# Patient Record
Sex: Male | Born: 1980 | Race: Black or African American | Hispanic: No | Marital: Single | Smoking: Former smoker
Health system: Southern US, Community
[De-identification: ages and names within clinical notes are randomized; demographics above are authoritative.]

## PROBLEM LIST (undated history)

## (undated) DIAGNOSIS — M1990 Unspecified osteoarthritis, unspecified site: Secondary | ICD-10-CM

## (undated) DIAGNOSIS — R569 Unspecified convulsions: Secondary | ICD-10-CM

## (undated) HISTORY — DX: Unspecified convulsions: R56.9

## (undated) HISTORY — DX: Unspecified osteoarthritis, unspecified site: M19.90

---

## 2008-06-13 ENCOUNTER — Emergency Department: Payer: Self-pay | Admitting: Emergency Medicine

## 2013-03-17 DIAGNOSIS — G40909 Epilepsy, unspecified, not intractable, without status epilepticus: Secondary | ICD-10-CM | POA: Insufficient documentation

## 2015-01-25 ENCOUNTER — Encounter: Payer: Self-pay | Admitting: *Deleted

## 2015-01-25 ENCOUNTER — Emergency Department: Payer: Managed Care, Other (non HMO)

## 2015-01-25 ENCOUNTER — Emergency Department
Admission: EM | Admit: 2015-01-25 | Discharge: 2015-01-26 | Disposition: A | Discharge status: Home / Self Care | Payer: Managed Care, Other (non HMO) | Attending: Emergency Medicine | Admitting: Emergency Medicine

## 2015-01-25 DIAGNOSIS — J029 Acute pharyngitis, unspecified: Secondary | ICD-10-CM | POA: Diagnosis not present

## 2015-01-25 DIAGNOSIS — R110 Nausea: Secondary | ICD-10-CM | POA: Insufficient documentation

## 2015-01-25 DIAGNOSIS — M5126 Other intervertebral disc displacement, lumbar region: Secondary | ICD-10-CM | POA: Diagnosis not present

## 2015-01-25 DIAGNOSIS — M5136 Other intervertebral disc degeneration, lumbar region: Secondary | ICD-10-CM

## 2015-01-25 DIAGNOSIS — M545 Low back pain, unspecified: Secondary | ICD-10-CM

## 2015-01-25 DIAGNOSIS — M51369 Other intervertebral disc degeneration, lumbar region without mention of lumbar back pain or lower extremity pain: Secondary | ICD-10-CM

## 2015-01-25 DIAGNOSIS — R000 Tachycardia, unspecified: Secondary | ICD-10-CM | POA: Insufficient documentation

## 2015-01-25 DIAGNOSIS — Z87891 Personal history of nicotine dependence: Secondary | ICD-10-CM | POA: Diagnosis not present

## 2015-01-25 DIAGNOSIS — R509 Fever, unspecified: Secondary | ICD-10-CM

## 2015-01-25 LAB — CBC
HCT: 43.2 % (ref 40.0–52.0)
Hemoglobin: 14.2 g/dL (ref 13.0–18.0)
MCH: 28.5 pg (ref 26.0–34.0)
MCHC: 32.9 g/dL (ref 32.0–36.0)
MCV: 86.8 fL (ref 80.0–100.0)
PLATELETS: 153 10*3/uL (ref 150–440)
RBC: 4.98 MIL/uL (ref 4.40–5.90)
RDW: 13.2 % (ref 11.5–14.5)
WBC: 12.4 10*3/uL — AB (ref 3.8–10.6)

## 2015-01-25 LAB — URINALYSIS COMPLETE WITH MICROSCOPIC (ARMC ONLY)
BILIRUBIN URINE: NEGATIVE
Bacteria, UA: NONE SEEN
GLUCOSE, UA: NEGATIVE mg/dL
HGB URINE DIPSTICK: NEGATIVE
KETONES UR: NEGATIVE mg/dL
LEUKOCYTES UA: NEGATIVE
NITRITE: NEGATIVE
Protein, ur: NEGATIVE mg/dL
RBC / HPF: NONE SEEN RBC/hpf (ref 0–5)
Specific Gravity, Urine: 1.006 (ref 1.005–1.030)
WBC, UA: NONE SEEN WBC/hpf (ref 0–5)
pH: 7 (ref 5.0–8.0)

## 2015-01-25 LAB — BASIC METABOLIC PANEL
Anion gap: 8 (ref 5–15)
BUN: 12 mg/dL (ref 6–20)
CALCIUM: 9.4 mg/dL (ref 8.9–10.3)
CO2: 30 mmol/L (ref 22–32)
CREATININE: 0.92 mg/dL (ref 0.61–1.24)
Chloride: 98 mmol/L — ABNORMAL LOW (ref 101–111)
Glucose, Bld: 99 mg/dL (ref 65–99)
Potassium: 3.7 mmol/L (ref 3.5–5.1)
SODIUM: 136 mmol/L (ref 135–145)

## 2015-01-25 MED ORDER — SODIUM CHLORIDE 0.9 % IV BOLUS (SEPSIS)
1000.0000 mL | Freq: Once | INTRAVENOUS | Status: AC
  Administered 2015-01-25: 1000 mL via INTRAVENOUS

## 2015-01-25 MED ORDER — ACETAMINOPHEN 325 MG PO TABS: 650.0000 mg | ORAL_TABLET | Freq: Once | ORAL | Status: AC

## 2015-01-25 MED ORDER — IOHEXOL 300 MG/ML  SOLN
100.0000 mL | Freq: Once | INTRAMUSCULAR | Status: AC | PRN
  Administered 2015-01-25: 100 mL via INTRAVENOUS

## 2015-01-25 MED ORDER — ACETAMINOPHEN 325 MG PO TABS
650.0000 mg | ORAL_TABLET | Freq: Once | ORAL | Status: AC
  Administered 2015-01-25: 650 mg via ORAL
  Filled 2015-01-25: qty 2

## 2015-01-25 MED ORDER — IOHEXOL 240 MG/ML SOLN
25.0000 mL | Freq: Once | INTRAMUSCULAR | Status: AC | PRN
  Administered 2015-01-25: 25 mL via ORAL

## 2015-01-25 NOTE — ED Provider Notes (Signed)
[facility] Emergency Department Provider Note  ____________________________________________  Time seen: 12:20 AM  I have reviewed the triage vital signs and the nursing notes.   HISTORY  Chief Complaint Back Pain     HPI Pedro [name] is a 34 y.o. male Presents with fever 103.1 tachycardia heart rate of 108 sore throat times one day. Patient states in addition that he's had low back pain status post moving furniture 2 weeks ago.      There are no active problems to display for this patient.   Past surgical history None No current outpatient prescriptions on file.  Allergies No known drug allergies No family history on file.  Social History Social History  Substance Use Topics  . Smoking status: Former Games developer  . Smokeless tobacco: Not on file  . Alcohol Use: Yes    Review of Systems  Constitutional: Negative for fever. Eyes: Negative for visual changes. ENT: Negative for sore throat. Cardiovascular: Negative for chest pain. Respiratory: Negative for shortness of breath. Gastrointestinal: Negative for abdominal pain, vomiting and diarrhea. Genitourinary: Negative for dysuria. Musculoskeletal: Negative for back pain. Skin: Negative for rash. Neurological: Negative for headaches, focal weakness or numbness.   10-point ROS otherwise negative.  ____________________________________________   PHYSICAL EXAM:  VITAL SIGNS: ED Triage Vitals  Enc Vitals Group     BP 01/25/15 2117 131/79 mmHg     Pulse Rate 01/25/15 2117 108     Resp 01/25/15 2117 20     Temp 01/25/15 2117 103.1 F (39.5 C)     Temp Source 01/25/15 2117 Oral     SpO2 01/25/15 2117 99 %     Weight 01/25/15 2117 234 lb (106.142 kg)     Height 01/25/15 2117 5\' 10"  (1.778 m)     Head Cir --      Peak Flow --      Pain Score 01/25/15 2118 9     Pain Loc --      Pain Edu? --      Excl. in GC? --      Constitutional: Alert and oriented. Well appearing and in  no distress. Eyes: Conjunctivae are normal. PERRL. Normal extraocular movements. ENT   Head: Normocephalic and atraumatic.   Nose: No congestion/rhinnorhea.   Mouth/Throat: Mucous membranes are moist.   Neck: Pharyngeal  erythema scant exudate  Hematological/Lymphatic/Immunilogical: Positive anterior cervical lymphadenopathy. Cardiovascular: Normal rate, regular rhythm. Normal and symmetric distal pulses are present in all extremities. No murmurs, rubs, or gallops. Respiratory: Normal respiratory effort without tachypnea nor retractions. Breath sounds are clear and equal bilaterally. No wheezes/rales/rhonchi. Gastrointestinal: Soft and nontender. No distention. There is no CVA tenderness. Genitourinary: deferred Musculoskeletal: Nontender with normal range of motion in all extremities. No joint effusions.  No lower extremity tenderness nor edema. Neurologic:  Normal speech and language. No gross focal neurologic deficits are appreciated. Speech is normal.  Skin:  Skin is warm, dry and intact. No rash noted. Psychiatric: Mood and affect are normal. Speech and behavior are normal. Patient exhibits appropriate insight and judgment.  ____________________________________________    LABS (pertinent positives/negatives)  Labs Reviewed  BASIC METABOLIC PANEL - Abnormal; Notable for the following:    Chloride 98 (*)    All other components within normal limits  CBC - Abnormal; Notable for the following:    WBC 12.4 (*)    All other components within normal limits  URINALYSIS COMPLETEWITH MICROSCOPIC ([facility] ONLY) - Abnormal; Notable for the following:    Color,  Urine STRAW (*)    APPearance CLEAR (*)    Squamous Epithelial / LPF 0-5 (*)    All other components within normal limits  MONONUCLEOSIS SCREEN       RADIOLOGY  Lumbar Spine Wo Contrast (Final result) Result time: 01/26/15 00:25:59   Procedure changed from CT Lumbar Spine W Contrast      Final result by Rad  Results In Interface (01/26/15 00:25:59)   Narrative:   CLINICAL DATA: Back pain for 2-3 weeks.  EXAM: CT LUMBAR SPINE WITHOUT CONTRAST  TECHNIQUE: Multidetector CT imaging of the lumbar spine reformation from CT of abdomen and pelvis from same day, reported separately. Multiplanar CT image reconstructions were also generated.  COMPARISON: CT abdomen and pelvis January 24, 2014  FINDINGS: Lumbar vertebral bodies and posterior elements are intact and aligned with maintenance of lumbar lordosis. Using the reference level of the last well-formed intervertebral disc as L5-S1, mild L5-S1 disc height loss. No destructive bony lesions.  Please see CT of the abdomen and pelvis from same day, reported separately for discussion of soft tissues.  Small L4-5 and L5-S1 broad-based disc bulges. No osseous canal stenosis or neural foraminal narrowing.  IMPRESSION: No acute fracture or malalignment.  Small L4-5 and L5-S1 broad-based disc bulges without neurocompressive changes.   Electronically Signed By: Awilda Metro M.D. On: 01/26/2015 00:25          CT Abdomen Pelvis W Contrast (Final result) Result time: 01/26/15 00:09:44   Final result by Rad Results In Interface (01/26/15 00:09:44)   Narrative:   CLINICAL DATA: Low back pain 2 weeks worse today. Fever and nausea.  EXAM: CT ABDOMEN AND PELVIS WITH CONTRAST  TECHNIQUE: Multidetector CT imaging of the abdomen and pelvis was performed using the standard protocol following bolus administration of intravenous contrast.  CONTRAST: OMNIPAQUE IOHEXOL 300 MG/ML SOLN  COMPARISON: None.  FINDINGS: Lung bases are within normal.  Abdominal images demonstrate a normal liver, spleen, pancreas, gallbladder and adrenal glands.  Kidneys are normal in size without hydronephrosis or nephrolithiasis. 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 Ureters unremarkable.  Appendix is  normal.  Colon is within normal. Small bowel is within normal. There is no free fluid or focal inflammatory change.  Pelvic images demonstrate the bladder, prostate and rectum to be within normal. Remaining bones and soft tissues are within normal.  IMPRESSION: No acute findings in the abdomen/ pelvis.   Electronically Signed By: Elberta Fortis M.D. On: 01/26/2015 00:09       INITIAL IMPRESSION / ASSESSMENT AND PLAN / ED COURSE  Pertinent labs & imaging results that were available during my care of the patient were reviewed by me and considered in my medical decision making (see chart for details).    ____________________________________________   FINAL CLINICAL IMPRESSION(S) / ED DIAGNOSES  Final diagnoses:  Fever  Lumbar spine pain  Pharyngitis  Herniated lumbar intervertebral disc      Darci Current, MD 01/28/15 [phone]

## 2015-01-25 NOTE — ED Notes (Addendum)
Pt has low back pain for 2 weeks.   Pt states pain worse today.  Fever today.  Pt has nausea.  No v/d. No hx of kidney stones.

## 2015-01-26 DIAGNOSIS — M51369 Other intervertebral disc degeneration, lumbar region without mention of lumbar back pain or lower extremity pain: Secondary | ICD-10-CM

## 2015-01-26 DIAGNOSIS — M5126 Other intervertebral disc displacement, lumbar region: Secondary | ICD-10-CM

## 2015-01-26 DIAGNOSIS — M5136 Other intervertebral disc degeneration, lumbar region: Secondary | ICD-10-CM

## 2015-01-26 LAB — MONONUCLEOSIS SCREEN: MONO SCREEN: NEGATIVE

## 2015-01-26 MED ORDER — AMOXICILLIN-POT CLAVULANATE 875-125 MG PO TABS
1.0000 | ORAL_TABLET | Freq: Once | ORAL | Status: AC
  Administered 2015-01-26: 1 via ORAL
  Filled 2015-01-26: qty 1

## 2015-01-26 MED ORDER — IBUPROFEN 600 MG PO TABS
ORAL_TABLET | ORAL | Status: AC
  Filled 2015-01-26: qty 1

## 2015-01-26 MED ORDER — AMOXICILLIN-POT CLAVULANATE 875-125 MG PO TABS
1.0000 | ORAL_TABLET | Freq: Two times a day (BID) | ORAL | Status: AC
Start: 2015-01-26 — End: 2015-02-04

## 2015-01-26 MED ORDER — IBUPROFEN 600 MG PO TABS
600.0000 mg | ORAL_TABLET | Freq: Once | ORAL | Status: AC
  Administered 2015-01-26: 600 mg via ORAL

## 2015-01-26 NOTE — ED Notes (Signed)
POC STREP NEGATIVE 

## 2015-01-26 NOTE — Discharge Instructions (Signed)
Pharyngitis Pharyngitis is redness, pain, and swelling (inflammation) of your pharynx.  CAUSES  Pharyngitis is usually caused by infection. Most of the time, these infections are from viruses (viral) and are part of a cold. However, sometimes pharyngitis is caused by bacteria (bacterial). Pharyngitis can also be caused by allergies. Viral pharyngitis may be spread from person to person by coughing, sneezing, and personal items or utensils (cups, forks, spoons, toothbrushes). Bacterial pharyngitis may be spread from person to person by more intimate contact, such as kissing.  SIGNS AND SYMPTOMS  Symptoms of pharyngitis include:   Sore throat.   Tiredness (fatigue).   Low-grade fever.   Headache.  Joint pain and muscle aches.  Skin rashes.  Swollen lymph nodes.  Plaque-like film on throat or tonsils (often seen with bacterial pharyngitis). DIAGNOSIS  Your health care provider will ask you questions about your illness and your symptoms. Your medical history, along with a physical exam, is often all that is needed to diagnose pharyngitis. Sometimes, a rapid strep test is done. Other lab tests may also be done, depending on the suspected cause.  TREATMENT  Viral pharyngitis will usually get better in 3-4 days without the use of medicine. Bacterial pharyngitis is treated with medicines that kill germs (antibiotics).  HOME CARE INSTRUCTIONS   Drink enough water and fluids to keep your urine clear or pale yellow.   Only take over-the-counter or prescription medicines as directed by your health care provider:   If you are prescribed antibiotics, make sure you finish them even if you start to feel better.   Do not take aspirin.   Get lots of rest.   Gargle with 8 oz of salt water ( tsp of salt per 1 qt of water) as often as every 1-2 hours to soothe your throat.   Throat lozenges (if you are not at risk for choking) or sprays may be used to soothe your throat. SEEK MEDICAL  CARE IF:   You have large, tender lumps in your neck.  You have a rash.  You cough up green, yellow-Mosella Kasa, or bloody spit. SEEK IMMEDIATE MEDICAL CARE IF:   Your neck becomes stiff.  You drool or are unable to swallow liquids.  You vomit or are unable to keep medicines or liquids down.  You have severe pain that does not go away with the use of recommended medicines.  You have trouble breathing (not caused by a stuffy nose). MAKE SURE YOU:   Understand these instructions.  Will watch your condition.  Will get help right away if you are not doing well or get worse. Document Released: 05/01/2005 Document Revised: 02/19/2013 Document Reviewed: 01/06/2013 [facility] Patient Information 2015 [place], [place]. This information is not intended to replace advice given to you by your health care provider. Make sure you discuss any questions you have with your health care provider.  Herniated Disk A herniated disk occurs when a disk in your spine bulges out too far. This condition is also called a ruptured disk or slipped disk. Your spine (backbone) is made up of bones called vertebrae. Between each pair of vertebrae is an oval disk with a soft, spongy center that acts as a shock absorber when you move. The spongy center is surrounded by a tough outer ring. When you have a herniated disk, the spongy center of the disk bulges out or ruptures through the outer ring. A herniated disk can press on a nerve between your vertebrae and cause pain. A herniated disk can occur  anywhere in your back or neck area, but the lower back is the most common spot. CAUSES  In many cases, a herniated disk occurs just from getting older. As you age, the spongy insides of your disks tend to shrink and dry out. A herniated disk can result from gradual wear and tear. Injury or sudden strain can also cause a herniated disk.  RISK FACTORS Aging is the main risk factor for a herniated disk. Other risk factors  include:  Being a man between the ages of 107 and 28 years.  Having a job that requires heavy lifting, bending, or twisting.  Having a job that requires long hours of driving.  Not getting enough exercise.  Being overweight.  Smoking. SIGNS AND SYMPTOMS  Signs and symptoms depend on which disk is herniated.  For a herniated disk in the lower back, you may have sharp pain in:  One part of your leg, hip, or buttocks.  The back of your calf.  The top or sole of your foot (sciatica).   For a herniated disk in the neck, you may feel pain:  When you move your neck.  Near or over your shoulder blade.  That moves to your upper arm, forearm, or fingers.   You may also have muscle weakness. It may be hard to:  Lift your leg or arm.  Stand on your toes.  Squeeze tightly with one of your hands.  Other symptoms can include:  Numbness or tingling in the affected areas of your body.  Loss of bladder or bowel control. This is a rare but serious sign of a severe herniated disk in the lower back. DIAGNOSIS  Your health care provider will do a physical exam. During this exam, you may have to move certain body parts or assume various positions. For example, your health care provider may do the straight-leg test. This is a good way to test for a herniated disk in your lower back. In this test, the health care provider lifts your leg while you lie on your back. This is to see if you feel pain down your leg. Your health care provider will also check for numbness or loss of feeling.  Your health care provider will also check your:  Reflexes.  Muscle strength.  Posture.  Other tests may be done to help in making a diagnosis. These may include:  An X-ray of the spine to rule out other causes of back pain.   Other imaging studies, such as an MRI or CT scan. This is to check whether the herniated disk is pressing on your spinal canal.  Electromyography (EMG). This test checks the  nerves that control muscles. It is sometimes used to identify the specific area of nerve involvement.  TREATMENT  In many cases, herniated disk symptoms go away over a period of days or weeks. You will most likely be free of symptoms in 3-4 months. Treatment may include the following:  The initial treatment for a herniated disk is ashort period of rest.  Bed rest is often limited to 1 or 2 days. Resting for too long delays recovery.  If you have a herniated disk in your lower back, you should avoid sitting as much as possible because sitting increases pressure on the disk.  Medicines. These may include:   Nonsteroidal anti-inflammatory drugs (NSAIDs).  Muscle relaxants for back spasms.  Narcotic pain medicine if your pain is very bad.   Steroid injections. You may need these along the involved nerve  root to help control pain. The steroid is injected in the area of the herniated disk. It helps by reducing swelling around the disk.  Physical therapy. This may include exercises to strengthen the muscles that help support your spine.   You may need surgery if other treatments do not work.  HOME CARE INSTRUCTIONS Follow all your health care provider's instructions. These may include:  Take all medicines as directed by your health care provider.  Rest for 2 days and then start moving.  Do not sit or stand for long periods of time.  Maintain good posture when sitting and standing.  Avoid movements that cause pain, such as bending or lifting.  When you are able to start lifting things again:  [place] with your knees.  Keep your back straight.  Hold heavy objects close to your body.  If you are overweight, ask your health care provider to help you start a weight-loss program.  When you are able to start exercising, ask your health care provider how much and what type of exercise is best for you.  Work with a physical therapist on stretching and strengthening exercises for  your back.  Do not wear high-heeled shoes.  Do not sleep on your belly.  Do not smoke.  Keep all follow-up visits as directed by your health care provider. SEEK MEDICAL CARE IF:  You have back or neck pain that is not getting better after 4 weeks.  You have very bad pain in your back or neck.  You develop numbness, tingling, or weakness along with pain. SEEK IMMEDIATE MEDICAL CARE IF:   You have numbness, tingling, or weakness that makes you unable to use your arms or legs.  You lose control of your bladder or bowels.  You have dizziness or fainting.  You have shortness of breath.  MAKE SURE YOU:   Understand these instructions.  Will watch your condition.  Will get help right away if you are not doing well or get worse. Document Released: 04/28/2000 Document Revised: 09/15/2013 Document Reviewed: 04/04/2013 [facility] Patient Information 2015 [place], [place]. This information is not intended to replace advice given to you by your health care provider. Make sure you discuss any questions you have with your health care provider.

## 2015-01-27 ENCOUNTER — Other Ambulatory Visit: Payer: Self-pay | Admitting: Family Medicine

## 2015-01-27 ENCOUNTER — Encounter: Payer: Self-pay | Admitting: Family Medicine

## 2015-01-27 ENCOUNTER — Ambulatory Visit (INDEPENDENT_AMBULATORY_CARE_PROVIDER_SITE_OTHER): Payer: Managed Care, Other (non HMO) | Admitting: Family Medicine

## 2015-01-27 VITALS — BP 122/64 | HR 71 | Temp 98.8°F | Resp 16 | Wt 236.2 lb

## 2015-01-27 DIAGNOSIS — M5416 Radiculopathy, lumbar region: Secondary | ICD-10-CM | POA: Diagnosis not present

## 2015-01-27 DIAGNOSIS — Q892 Congenital malformations of other endocrine glands: Secondary | ICD-10-CM | POA: Insufficient documentation

## 2015-01-27 DIAGNOSIS — G40802 Other epilepsy, not intractable, without status epilepticus: Secondary | ICD-10-CM | POA: Insufficient documentation

## 2015-01-27 MED ORDER — TRAMADOL HCL 50 MG PO TABS: 50.0000 mg | ORAL_TABLET | Freq: Four times a day (QID) | ORAL | Status: DC | PRN

## 2015-01-27 MED ORDER — IBUPROFEN 800 MG PO TABS
800.0000 mg | ORAL_TABLET | Freq: Three times a day (TID) | ORAL | Status: DC | PRN
Start: 2015-01-27 — End: 2017-02-21

## 2015-01-27 MED ORDER — CYCLOBENZAPRINE HCL 5 MG PO TABS: 5.0000 mg | ORAL_TABLET | Freq: Three times a day (TID) | ORAL | Status: DC | PRN

## 2015-01-27 NOTE — Progress Notes (Signed)
Name: Pedro [name]   MRN: [id number]    DOB: 01/02/1981   Date:01/27/2015       Progress Note  Subjective  Chief Complaint  Chief Complaint  Patient presents with  . Follow-up    patient is here for his hospital follow-up  . Back Pain    HPI  Pedro [name] is a 34 year old male with know history of grand mal seizures who is here today to discuss his recent ER visit, CT scan showing lumbar disc disorder. He was lifting a heavy item and felt a pop in his lower back and since then is having pain without numbness or tingling. Patient complains of arthralgias for which has been present for several days. Pain is located in lower back midline, is described as aching and shooting, and is intermittent, moderate .  Associated symptoms include: decreased range of motion.  The patient has tried nothing for pain relief.  Related to injury:   yes.   Patient Active Problem List   Diagnosis Date Noted  . 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 Acute lumbar radiculopathy 01/26/2015    Social History  Substance Use Topics  . Smoking status: Former Research scientist (life sciences)  . Smokeless tobacco: Not on file  . Alcohol Use: 0.0 oz/week    0 Standard drinks or equivalent per week     Current outpatient prescriptions:  .  amoxicillin-clavulanate (AUGMENTIN) 875-125 MG per tablet, Take 1 tablet by mouth 2 (two) times daily., Disp: 20 tablet, Rfl: 0 .  CARBATROL 200 MG 12 hr capsule, Take 5 capsules by mouth daily. 2 capsule in the morning and 3 capsules in the evening, Disp: , Rfl: 5 .  DEPAKOTE ER 500 MG 24 hr tablet, Take 2 tablets by mouth at bedtime., Disp: , Rfl: 5 .  cyclobenzaprine (FLEXERIL) 5 MG tablet, Take 1-2 tablets (5-10 mg total) by mouth 3 (three) times daily as needed for muscle spasms., Disp: 50 tablet, Rfl: 1 .  ibuprofen (ADVIL,MOTRIN) 800 MG tablet, Take 1 tablet (800 mg total) by mouth every 8 (eight) hours as needed., Disp: 50 tablet, Rfl: 1 .  traMADol (ULTRAM)  50 MG tablet, Take 1 tablet (50 mg total) by mouth every 6 (six) hours as needed for moderate pain or severe pain., Disp: 120 tablet, Rfl: 0  No past surgical history on file.  No family history on file.  Allergies  Allergen Reactions  . Caffeine   . Nicotine      Review of Systems  CONSTITUTIONAL: No significant weight changes, fever, chills, weakness or fatigue.  HEENT:  - Eyes: No visual changes.  - Ears: No auditory changes. No pain.  - Nose: No sneezing, congestion, runny nose. - Throat: No sore throat. No changes in swallowing. SKIN: No rash or itching.  CARDIOVASCULAR: No chest pain, chest pressure or chest discomfort. No palpitations or edema.  RESPIRATORY: No shortness of breath, cough or sputum.  GASTROINTESTINAL: No anorexia, nausea, vomiting. No changes in bowel habits. No abdominal pain or blood.  GENITOURINARY: No dysuria. No frequency. No discharge.  NEUROLOGICAL: No headache, dizziness, syncope, paralysis, ataxia, numbness or tingling in the extremities. No memory changes. No change in bowel or bladder control.  MUSCULOSKELETAL: Yes back joint pain. No muscle pain. HEMATOLOGIC: No anemia, bleeding or bruising.  LYMPHATICS: No enlarged lymph nodes.  PSYCHIATRIC: No change in mood. No change in sleep pattern.  ENDOCRINOLOGIC: No reports of sweating, cold or heat intolerance. No polyuria or polydipsia.  Objective  BP 122/64 mmHg  Pulse 71  Temp(Src) 98.8 F (37.1 C) (Oral)  Resp 16  Wt 236 lb 3.2 oz (107.14 kg)  SpO2 97% Body mass index is 33.89 kg/(m^2).  Physical Exam  Constitutional: Patient appears well-developed and well-nourished. In no distress.  Neck: Normal range of motion. Neck supple. No JVD present. No thyromegaly present.  Cardiovascular: Normal rate, regular rhythm and normal heart sounds.  No murmur heard.  Pulmonary/Chest: Effort normal and breath sounds normal. No respiratory distress. Musculoskeletal: Normal range of motion  bilateral UE and LE, no joint effusions. Lumbar spine with no palpable step off, pain reproduced over lumbosacral area midline, negative straight leg testing.  Peripheral vascular: Bilateral LE no edema. Neurological: CN II-XII grossly intact with no focal deficits. Alert and oriented to person, place, and time. Coordination, balance, strength, speech and gait are normal.  Skin: Skin is warm and dry. No rash noted. No erythema.  Psychiatric: Patient has a normal mood and affect. Behavior is normal in office today. Judgment and thought content normal in office today.   Recent Results (from the past 2160 hour(s))  Basic metabolic panel     Status: Abnormal   Collection Time: 01/25/15  9:22 PM  Result Value Ref Range   Sodium 136 135 - 145 mmol/L   Potassium 3.7 3.5 - 5.1 mmol/L   Chloride 98 (L) 101 - 111 mmol/L   CO2 30 22 - 32 mmol/L   Glucose, Bld 99 65 - 99 mg/dL   BUN 12 6 - 20 mg/dL   Creatinine, Ser 0.92 0.61 - 1.24 mg/dL   Calcium 9.4 8.9 - 10.3 mg/dL   GFR calc non Af Amer >60 >60 mL/min   GFR calc Af Amer >60 >60 mL/min    Comment: (NOTE) The eGFR has been calculated using the CKD EPI equation. This calculation has not been validated in all clinical situations. eGFR's persistently <60 mL/min signify possible Chronic Kidney Disease.    Anion gap 8 5 - 15  CBC     Status: Abnormal   Collection Time: 01/25/15  9:22 PM  Result Value Ref Range   WBC 12.4 (H) 3.8 - 10.6 K/uL   RBC 4.98 4.40 - 5.90 MIL/uL   Hemoglobin 14.2 13.0 - 18.0 g/dL   HCT 43.2 40.0 - 52.0 %   MCV 86.8 80.0 - 100.0 fL   MCH 28.5 26.0 - 34.0 pg   MCHC 32.9 32.0 - 36.0 g/dL   RDW 13.2 11.5 - 14.5 %   Platelets 153 150 - 440 K/uL  Urinalysis complete, with microscopic ([facility] only)     Status: Abnormal   Collection Time: 01/25/15  9:22 PM  Result Value Ref Range   Color, Urine STRAW (A) YELLOW   APPearance CLEAR (A) CLEAR   Glucose, UA NEGATIVE NEGATIVE mg/dL   Bilirubin Urine NEGATIVE NEGATIVE    Ketones, ur NEGATIVE NEGATIVE mg/dL   Specific Gravity, Urine 1.006 1.005 - 1.030   Hgb urine dipstick NEGATIVE NEGATIVE   pH 7.0 5.0 - 8.0   Protein, ur NEGATIVE NEGATIVE mg/dL   Nitrite NEGATIVE NEGATIVE   Leukocytes, UA NEGATIVE NEGATIVE   RBC / HPF NONE SEEN 0 - 5 RBC/hpf   WBC, UA NONE SEEN 0 - 5 WBC/hpf   Bacteria, UA NONE SEEN NONE SEEN   Squamous Epithelial / LPF 0-5 (A) NONE SEEN   Mucous PRESENT   Mononucleosis screen     Status: None   Collection Time: 01/25/15 11:09 PM  Result Value Ref Range   Mono Screen NEGATIVE NEGATIVE     Assessment & Plan  1. Acute lumbar radiculopathy CT Lumbar done 01/2015 in ER: No acute fracture or malalignment. Small L4-5 and L5-S1 broad-based disc bulges without neurocompressive changes. He has decided on deferring neurosurgical consultaion for now. We discussed long term risk of reoccurrence. Maintaining an ideal body habitus, regular exercise, proper lifting techniques and mindfulness of exacerbating factors will be useful in long term management.  Instructed on use of heating pad with exercises. Consider concomitant therapy with PT, massage therapist or chiropractor. May use anti-inflammatory medication and muscle relaxer as needed.  - traMADol (ULTRAM) 50 MG tablet; Take 1 tablet (50 mg total) by mouth every 6 (six) hours as needed for moderate pain or severe pain.  Dispense: 120 tablet; Refill: 0 - cyclobenzaprine (FLEXERIL) 5 MG tablet; Take 1-2 tablets (5-10 mg total) by mouth 3 (three) times daily as needed for muscle spasms.  Dispense: 50 tablet; Refill: 1 - ibuprofen (ADVIL,MOTRIN) 800 MG tablet; Take 1 tablet (800 mg total) by mouth every 8 (eight) hours as needed.  Dispense: 50 tablet; Refill: 1

## 2015-01-27 NOTE — Patient Instructions (Signed)

## 2015-02-24 ENCOUNTER — Ambulatory Visit: Payer: Managed Care, Other (non HMO) | Admitting: Family Medicine

## 2015-03-01 ENCOUNTER — Encounter: Payer: Self-pay | Admitting: Family Medicine

## 2015-03-01 ENCOUNTER — Ambulatory Visit (INDEPENDENT_AMBULATORY_CARE_PROVIDER_SITE_OTHER): Payer: Managed Care, Other (non HMO) | Admitting: Family Medicine

## 2015-03-01 VITALS — BP 118/68 | HR 72 | Temp 99.1°F | Resp 16 | Wt 239.4 lb

## 2015-03-01 DIAGNOSIS — M5126 Other intervertebral disc displacement, lumbar region: Secondary | ICD-10-CM | POA: Diagnosis not present

## 2015-03-01 DIAGNOSIS — G40409 Other generalized epilepsy and epileptic syndromes, not intractable, without status epilepticus: Secondary | ICD-10-CM

## 2015-03-01 DIAGNOSIS — M5136 Other intervertebral disc degeneration, lumbar region: Secondary | ICD-10-CM

## 2015-03-01 DIAGNOSIS — Z2821 Immunization not carried out because of patient refusal: Secondary | ICD-10-CM | POA: Insufficient documentation

## 2015-03-01 DIAGNOSIS — M51369 Other intervertebral disc degeneration, lumbar region without mention of lumbar back pain or lower extremity pain: Secondary | ICD-10-CM

## 2015-03-01 DIAGNOSIS — G40309 Generalized idiopathic epilepsy and epileptic syndromes, not intractable, without status epilepticus: Secondary | ICD-10-CM

## 2015-03-01 MED ORDER — DEPAKOTE ER 500 MG PO TB24: 1000.0000 mg | ORAL_TABLET | Freq: Every day | ORAL | Status: DC

## 2015-03-01 MED ORDER — CARBATROL 200 MG PO CP12: 1000.0000 mg | ORAL_CAPSULE | Freq: Every day | ORAL | Status: DC

## 2015-03-01 NOTE — Progress Notes (Signed)
Name: Pedro [name]   MRN: [id number]    DOB: December 23, 1980   Date:03/01/2015       Progress Note  Subjective  Chief Complaint  Chief Complaint  Patient presents with  . Medication Refill    depakote and carbatrol  . Back Pain    patient stated that he is doing much better, but he does still feel it when he is riding his motorcycle and he has to turn around.    HPI  Pedro [name] is a 34 year old male with know history of grand mal seizures who is here today to for routine follow up of seizure disorder and refills. No break through seizures this year. He recently has some back pain issues this year and he reports overall his symptoms have settled down. He never utilized the tramadol or muscle relaxer. Just took Ibuprofen and rested.   Past Medical History  Diagnosis Date  . Arthritis   . Seizures [facility])     Patient Active Problem List   Diagnosis Date Noted  . 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 Acute lumbar radiculopathy 01/26/2015    Social History  Substance Use Topics  . Smoking status: Former Games developer  . Smokeless tobacco: Not on file  . Alcohol Use: 0.0 oz/week    0 Standard drinks or equivalent per week     Current outpatient prescriptions:  .  CARBATROL 200 MG 12 hr capsule, Take 5 capsules by mouth daily. 2 capsule in the morning and 3 capsules in the evening, Disp: , Rfl: 5 .  DEPAKOTE ER 500 MG 24 hr tablet, Take 2 tablets by mouth at bedtime., Disp: , Rfl: 5 .  ibuprofen (ADVIL,MOTRIN) 800 MG tablet, Take 1 tablet (800 mg total) by mouth every 8 (eight) hours as needed., Disp: 50 tablet, Rfl: 1 .  cyclobenzaprine (FLEXERIL) 5 MG tablet, Take 1-2 tablets (5-10 mg total) by mouth 3 (three) times daily as needed for muscle spasms. (Patient not taking: Reported on 03/01/2015), Disp: 50 tablet, Rfl: 1 .  traMADol (ULTRAM) 50 MG tablet, Take 1 tablet (50 mg total) by mouth every 6 (six) hours as needed for moderate pain or severe  pain. (Patient not taking: Reported on 03/01/2015), Disp: 120 tablet, Rfl: 0  Allergies  Allergen Reactions  . Caffeine   . Nicotine     Review of Systems  Positive for back pain as mentioned in HPI, otherwise all systems reviewed and are negative.  Objective  BP 118/68 mmHg  Pulse 72  Temp(Src) 99.1 F (37.3 C) (Oral)  Resp 16  Wt 239 lb 6.4 oz (108.591 kg)  SpO2 98%  Body mass index is 34.35 kg/(m^2).   Physical Exam  Constitutional: Patient appears well-developed and well-nourished. In no distress.  HEENT:  - Head: Normocephalic and atraumatic.  - Ears: Bilateral TMs gray, no erythema or effusion - Nose: Nasal mucosa moist - Mouth/Throat: Oropharynx is clear and moist. No tonsillar hypertrophy or erythema. No post nasal drainage.  - Eyes: Conjunctivae clear, EOM movements normal. PERRLA. No scleral icterus.  Neck: Normal range of motion. Neck supple. No JVD present. No thyromegaly present.  Cardiovascular: Normal rate, regular rhythm and normal heart sounds.  No murmur heard.  Pulmonary/Chest: Effort normal and breath sounds normal. No respiratory distress. Musculoskeletal: Normal range of motion bilateral UE and LE, no joint effusions. Peripheral vascular: Bilateral LE no edema. Neurological: CN II-XII grossly intact with no focal deficits. Alert and oriented to person,  place, and time. Coordination, balance, strength, speech and gait are normal.  Skin: Skin is warm and dry. No rash noted. No erythema.  Psychiatric: Patient has a normal mood and affect. Behavior is normal in office today. Judgment and thought content normal in office today.    Assessment & Plan  1. 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  - CARBATROL 200 MG 12 hr capsule; Take 5 capsules (1,000 mg total) by mouth daily. 2 capsule in the morning and 3 capsules in the evening  Dispense: 150 capsule; Refill: 5 - DEPAKOTE ER 500 MG 24 hr tablet; Take 2 tablets (1,000 mg total) by mouth at bedtime.   Dispense: 60 tablet; Refill: 5  2. Bulge of lumbar disc without myelopathy Stable.   3. Influenza vaccination declined by patient

## 2015-08-04 ENCOUNTER — Ambulatory Visit (INDEPENDENT_AMBULATORY_CARE_PROVIDER_SITE_OTHER): Payer: Managed Care, Other (non HMO) | Admitting: Family Medicine

## 2015-08-04 ENCOUNTER — Encounter: Payer: Self-pay | Admitting: Family Medicine

## 2015-08-04 VITALS — BP 118/74 | HR 63 | Temp 97.9°F | Resp 16 | Ht 70.0 in | Wt 247.5 lb

## 2015-08-04 DIAGNOSIS — E785 Hyperlipidemia, unspecified: Secondary | ICD-10-CM | POA: Diagnosis not present

## 2015-08-04 DIAGNOSIS — M545 Low back pain, unspecified: Secondary | ICD-10-CM

## 2015-08-04 DIAGNOSIS — Z23 Encounter for immunization: Secondary | ICD-10-CM | POA: Diagnosis not present

## 2015-08-04 DIAGNOSIS — G40802 Other epilepsy, not intractable, without status epilepticus: Secondary | ICD-10-CM

## 2015-08-04 NOTE — Progress Notes (Signed)
Name: Pedro [name]   MRN: [id number]    DOB: 1980-05-20   Date:08/04/2015       Progress Note  Subjective  Chief Complaint  Chief Complaint  Patient presents with  . Medication Refill    5 month F/U  . Seizures    Well controlled, with Brand Necessary.  Has not had one for 12 years but has been on the same dose and brand name.   . Back Pain    Well controlled with medication.    HPI  Epilepsy: he states diagnosed at age 35 with generalized seizures, sometimes with tonic clonic, and sometimes just syncope and other times inapropriate behavior ( talking , wondering ), [facility], but was discharged from their care a few years ago. He was advised to stop medication, but he is terrified of recurrence of symptoms. He works in [employer] and does not want to lose his job.   Back pain: he has intermittent low back pain, he takes Ibuprofen and Flexeril prn. He was diagnosed with herniated disk on lumbar spine. He denies paresthesia or weakness. He is doing well at this time, pain at this time is 0/10.  Dyslipidemia: he states total cholesterol was 240 at work , he states the HDL was low also.   Patient Active Problem List   Diagnosis Date Noted  . 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 Bulge of lumbar disc without myelopathy 01/26/2015    History reviewed. No pertinent past surgical history.  Family History  Problem Relation Age of Onset  . Hyperlipidemia Mother   . Hypertension Mother   . Hyperlipidemia Father   . Hypertension Father     Social History   Social History  . Marital Status: Single    Spouse Name: N/A  . Number of Children: N/A  . Years of Education: N/A   Occupational History  . Not on file.   Social History Main Topics  . Smoking status: Former Smoker -- 5 years    Types: Cigarettes    Start date: 05/16/1995    Quit date: 05/16/1999  . Smokeless tobacco: Never  Used  . Alcohol Use: 0.0 oz/week    0 Standard drinks or equivalent per week  . Drug Use: No  . Sexual Activity:    Partners: Female   Other Topics Concern  . Not on file   Social History Narrative     Current outpatient prescriptions:  .  CARBATROL 200 MG 12 hr capsule, Take 5 capsules (1,000 mg total) by mouth daily. 2 capsule in the morning and 3 capsules in the evening, Disp: 150 capsule, Rfl: 5 .  cyclobenzaprine (FLEXERIL) 5 MG tablet, Take 5 mg by mouth 3 (three) times daily as needed for muscle spasms., Disp: , Rfl:  .  DEPAKOTE ER 500 MG 24 hr tablet, Take 2 tablets (1,000 mg total) by mouth at bedtime., Disp: 60 tablet, Rfl: 5 .  ibuprofen (ADVIL,MOTRIN) 800 MG tablet, Take 1 tablet (800 mg total) by mouth every 8 (eight) hours as needed., Disp: 50 tablet, Rfl: 1  Allergies  Allergen Reactions  . Caffeine   . Nicotine      ROS  Constitutional: Negative for fever or weight change.  Respiratory: Negative for cough and shortness of breath.   Cardiovascular: Negative for chest pain or palpitations.  Gastrointestinal: Negative for abdominal pain, no bowel changes.  Musculoskeletal: Negative for gait problem  or joint swelling.  Skin: Negative for rash.  Neurological: Negative for dizziness or headache.  No other specific complaints in a complete review of systems (except as listed in HPI above).  Objective  Filed Vitals:   08/04/15 0925  BP: 118/74  Pulse: 63  Temp: 97.9 F (36.6 C)  TempSrc: Oral  Resp: 16  Height:  (1.778 m)  Weight: 247 lb 8 oz (112.265 kg)  SpO2: 98%    Body mass index is 35.51 kg/(m^2).  Physical Exam  Constitutional: Patient appears well-developed and well-nourished. Obese  No distress.  HEENT: head atraumatic, normocephalic, pupils equal and reactive to light, neck supple, throat within normal limits Cardiovascular: Normal rate, regular rhythm and normal heart sounds.  No murmur heard. No BLE edema. Pulmonary/Chest: Effort  normal and breath sounds normal. No respiratory distress. Abdominal: Soft.  There is no tenderness. Psychiatric: Patient has a normal mood and affect. behavior is normal. Judgment and thought content normal.  PHQ2/9: Depression screen [facility] 2/9 08/04/2015 01/27/2015  Decreased Interest 0 0  Down, Depressed, Hopeless 0 0  PHQ - 2 Score 0 0    Fall Risk: Fall Risk  08/04/2015 01/27/2015  Falls in the past year? No No    Functional Status Survey: Is the patient deaf or have difficulty hearing?: No Does the patient have difficulty seeing, even when wearing glasses/contacts?: No Does the patient have difficulty concentrating, remembering, or making decisions?: No Does the patient have difficulty walking or climbing stairs?: No Does the patient have difficulty dressing or bathing?: No Does the patient have difficulty doing errands alone such as visiting a doctor's office or shopping?: No    Assessment & Plan  1. 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 Intermittent low back pain  Continue prn medication   3. Dyslipidemia  Lipid panel shows low HDL : to improve HDL patient  needs to eat tree nuts ( pecans/pistachios/almonds ) four times weekly, eat fish two times weekly  and exercise  at least 150 minutes per week - Lipid panel   4. Need for Tdap vaccination  - Tdap vaccine greater than or equal to 7yo IM

## 2015-08-05 ENCOUNTER — Other Ambulatory Visit: Payer: Self-pay | Admitting: Family Medicine

## 2015-08-05 DIAGNOSIS — G40409 Other generalized epilepsy and epileptic syndromes, not intractable, without status epilepticus: Secondary | ICD-10-CM

## 2015-08-05 DIAGNOSIS — G40309 Generalized idiopathic epilepsy and epileptic syndromes, not intractable, without status epilepticus: Secondary | ICD-10-CM

## 2015-08-05 LAB — COMPREHENSIVE METABOLIC PANEL
ALBUMIN: 4.5 g/dL (ref 3.5–5.5)
ALT: 18 IU/L (ref 0–44)
AST: 19 IU/L (ref 0–40)
Albumin/Globulin Ratio: 1.6 (ref 1.2–2.2)
Alkaline Phosphatase: 78 IU/L (ref 39–117)
BUN / CREAT RATIO: 15 (ref 8–19)
BUN: 15 mg/dL (ref 6–20)
Bilirubin Total: 0.2 mg/dL (ref 0.0–1.2)
CALCIUM: 9.1 mg/dL (ref 8.7–10.2)
CO2: 27 mmol/L (ref 18–29)
CREATININE: 0.99 mg/dL (ref 0.76–1.27)
Chloride: 98 mmol/L (ref 96–106)
GFR calc Af Amer: 113 mL/min/{1.73_m2} (ref >59)
GFR, EST NON AFRICAN AMERICAN: 98 mL/min/{1.73_m2} (ref >59)
GLOBULIN, TOTAL: 2.9 g/dL (ref 1.5–4.5)
GLUCOSE: 87 mg/dL (ref 65–99)
Potassium: 4 mmol/L (ref 3.5–5.2)
SODIUM: 141 mmol/L (ref 134–144)
Total Protein: 7.4 g/dL (ref 6.0–8.5)

## 2015-08-05 LAB — LIPID PANEL
CHOL/HDL RATIO: 3 ratio (ref 0.0–5.0)
Cholesterol, Total: 243 mg/dL — ABNORMAL HIGH (ref 100–199)
HDL: 80 mg/dL (ref >39)
LDL CALC: 150 mg/dL — AB (ref 0–99)
TRIGLYCERIDES: 64 mg/dL (ref 0–149)
VLDL CHOLESTEROL CAL: 13 mg/dL (ref 5–40)

## 2015-08-05 LAB — CBC
Hematocrit: 42.7 % (ref 37.5–51.0)
Hemoglobin: 14.5 g/dL (ref 12.6–17.7)
MCH: 29.2 pg (ref 26.6–33.0)
MCHC: 34 g/dL (ref 31.5–35.7)
MCV: 86 fL (ref 79–97)
Platelets: 221 10*3/uL (ref 150–379)
RBC: 4.96 x10E6/uL (ref 4.14–5.80)
RDW: 13.9 % (ref 12.3–15.4)
WBC: 5.7 10*3/uL (ref 3.4–10.8)

## 2015-08-05 LAB — CARBAMAZEPINE LEVEL, TOTAL: CARBAMAZEPINE LVL: 7.4 ug/mL (ref 4.0–12.0)

## 2015-08-05 LAB — VITAMIN D 25 HYDROXY (VIT D DEFICIENCY, FRACTURES): Vit D, 25-Hydroxy: 11.7 ng/mL — ABNORMAL LOW (ref 30.0–100.0)

## 2015-08-05 LAB — VALPROIC ACID LEVEL: VALPROIC ACID LVL: 61 ug/mL (ref 50–100)

## 2015-08-05 MED ORDER — DEPAKOTE ER 500 MG PO TB24: 1000.0000 mg | ORAL_TABLET | Freq: Every day | ORAL | Status: DC

## 2015-08-05 MED ORDER — CARBATROL 200 MG PO CP12: 1000.0000 mg | ORAL_CAPSULE | Freq: Every day | ORAL | Status: DC

## 2016-01-06 ENCOUNTER — Other Ambulatory Visit: Payer: Self-pay | Admitting: Family Medicine

## 2016-01-06 ENCOUNTER — Ambulatory Visit: Payer: Managed Care, Other (non HMO) | Admitting: Family Medicine

## 2016-01-06 DIAGNOSIS — G40409 Other generalized epilepsy and epileptic syndromes, not intractable, without status epilepticus: Secondary | ICD-10-CM

## 2016-01-06 DIAGNOSIS — G40309 Generalized idiopathic epilepsy and epileptic syndromes, not intractable, without status epilepticus: Secondary | ICD-10-CM

## 2016-02-01 ENCOUNTER — Encounter: Payer: Self-pay | Admitting: Family Medicine

## 2016-02-01 ENCOUNTER — Ambulatory Visit (INDEPENDENT_AMBULATORY_CARE_PROVIDER_SITE_OTHER): Payer: Managed Care, Other (non HMO) | Admitting: Family Medicine

## 2016-02-01 VITALS — BP 116/82 | HR 75 | Temp 97.9°F | Wt 255.7 lb

## 2016-02-01 DIAGNOSIS — E559 Vitamin D deficiency, unspecified: Secondary | ICD-10-CM | POA: Insufficient documentation

## 2016-02-01 DIAGNOSIS — G40409 Other generalized epilepsy and epileptic syndromes, not intractable, without status epilepticus: Secondary | ICD-10-CM

## 2016-02-01 DIAGNOSIS — G40802 Other epilepsy, not intractable, without status epilepticus: Secondary | ICD-10-CM | POA: Diagnosis not present

## 2016-02-01 DIAGNOSIS — G40309 Generalized idiopathic epilepsy and epileptic syndromes, not intractable, without status epilepticus: Secondary | ICD-10-CM

## 2016-02-01 DIAGNOSIS — Z5181 Encounter for therapeutic drug level monitoring: Secondary | ICD-10-CM | POA: Insufficient documentation

## 2016-02-01 LAB — HEPATIC FUNCTION PANEL
ALBUMIN: 4.3 g/dL (ref 3.6–5.1)
ALK PHOS: 65 U/L (ref 40–115)
ALT: 15 U/L (ref 9–46)
AST: 18 U/L (ref 10–40)
BILIRUBIN TOTAL: 0.4 mg/dL (ref 0.2–1.2)
Bilirubin, Direct: 0.1 mg/dL (ref <=0.2)
Indirect Bilirubin: 0.3 mg/dL (ref 0.2–1.2)
TOTAL PROTEIN: 7.2 g/dL (ref 6.1–8.1)

## 2016-02-01 MED ORDER — DEPAKOTE ER 500 MG PO TB24: 1000.0000 mg | ORAL_TABLET | Freq: Every day | ORAL | 5 refills | Status: DC

## 2016-02-01 MED ORDER — CARBATROL 200 MG PO CP12: ORAL_CAPSULE | ORAL | 5 refills | Status: DC

## 2016-02-01 NOTE — Assessment & Plan Note (Signed)
Check vit D level. 

## 2016-02-01 NOTE — Progress Notes (Signed)
BP 116/82   Pulse 75   Temp 97.9 F (36.6 C)   Wt 255 lb 11.2 oz (116 kg)   SpO2 98%   BMI 36.69 kg/m    Subjective:    Patient ID: Pedro [name], male    DOB: 04/24/1981, 35 y.o.   MRN: [id number]  HPI: Pedro [name] is a 35 y.o. male  Chief Complaint  Patient presents with  . Medication Refill   Patient is here for follow-up and to get medicine refills He has epilepsy, and it runs in his family His last seizure was 2005 Not seeing neurologist any more, turned loose about 7 years ago b/c there was nothing more for neurologist to do, he says Mother had epilepsy, brother had it when he was younger too No old head injuries; injured back last year, but no head injuries  Vit D low in the past; he is not taking any supplementation; previous level was 11.7  High cholesterol; he eats bacon, sausage, hamburgers; [place] hot dogs; does like cheese  Depression screen [facility] 2/9 02/01/2016 08/04/2015 01/27/2015  Decreased Interest 0 0 0  Down, Depressed, Hopeless 0 0 0  PHQ - 2 Score 0 0 0   Relevant past medical, surgical, family and social history reviewed Past Medical History:  Diagnosis Date  . Arthritis   . Seizures (HCC)    No past surgical history on file.   Family History  Problem Relation Age of Onset  . Hyperlipidemia Mother   . Hypertension Mother   . Hyperlipidemia Father   . Hypertension Father    Social History  Substance Use Topics  . Smoking status: Former Smoker    Years: 5.00    Types: Cigarettes    Start date: 05/16/1995    Quit date: 05/16/1999  . Smokeless tobacco: Never Used  . Alcohol use 0.0 oz/week  MD note: one beer every once in a while  Interim medical history since last visit reviewed. Allergies and medications reviewed  Review of Systems Per HPI unless specifically indicated above     Objective:    BP 116/82   Pulse 75   Temp 97.9 F (36.6 C)   Wt 255 lb 11.2 oz (116 kg)   SpO2 98%   BMI 36.69 kg/m   Wt Readings from  Last 3 Encounters:  02/01/16 255 lb 11.2 oz (116 kg)  08/04/15 247 lb 8 oz (112.3 kg)  03/01/15 239 lb 6.4 oz (108.6 kg)    Physical Exam  Constitutional: He appears well-developed and well-nourished. No distress.  HENT:  Head: Normocephalic and atraumatic.  Eyes: EOM are normal. No scleral icterus.  Neck: No thyromegaly present.  Cardiovascular: Normal rate and regular rhythm.   Pulmonary/Chest: Effort normal and breath sounds normal.  Abdominal: Soft. He exhibits no distension.  Musculoskeletal: He exhibits no edema.  Neurological: He is alert. He displays no tremor. He displays no seizure activity. Coordination and gait normal.  Skin: Skin is warm and dry. No pallor.  Psychiatric: He has a normal mood and affect. His behavior is normal. Judgment and thought content normal. His mood appears not anxious. He does not exhibit a depressed mood.      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)    Turned loose by neurology years ago, maintained on current AEDs here at this office; reviewed previous levels, normal, and  patient opted to skip those today, but does agree with checking LFTs; refills provided; return in 6 months; cautioned patient to never put himself in harm's way by swimming alone, getting in bathtub unsupervised, not getting up on ladder, etc, so as not to be in situation where he could drown or fall if seizure occurred; he agrees      Relevant Medications   CARBATROL 200 MG 12 hr capsule   DEPAKOTE ER 500 MG 24 hr tablet     Other   Vitamin D deficiency    Check vit D level      Relevant Orders   VITAMIN D 25 Hydroxy (Vit-D Deficiency, Fractures) (Completed)   Encounter for medication monitoring    Check liver enzymes      Relevant Orders   Hepatic function panel (Completed)    Other Visit Diagnoses    Epilepsy, 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  An after-visit summary was printed and given to the patient at check-out.  Please see the patient instructions which may contain other information and recommendations beyond what is mentioned above in the assessment and plan.  Meds ordered this encounter  Medications  . CARBATROL 200 MG 12 hr capsule    Sig: Take 2 capsules by mouth in the morning and 3 capsules in the evening    Dispense:  150 capsule    Refill:  5    Brand name same brand as before only, medically neccessary  . DEPAKOTE ER 500 MG 24 hr tablet    Sig: Take 2 tablets (1,000 mg total) by mouth at bedtime.    Dispense:  60 tablet    Refill:  5    Brand name medically necessary, dispense as written    Orders Placed This Encounter  Procedures  . VITAMIN D 25 Hydroxy (Vit-D Deficiency, Fractures)  . Hepatic function panel

## 2016-02-01 NOTE — Assessment & Plan Note (Signed)
Check liver enzymes 

## 2016-02-01 NOTE — Patient Instructions (Addendum)
Try to limit saturated fats in your diet (bologna, hot dogs, barbeque, cheeseburgers, hamburgers, steak, bacon, sausage, cheese, etc.) and get more fresh fruits, vegetables, and whole grains Let's get labs today If you have not heard anything from my staff in a week about any orders/referrals/studies from today, please contact us here to follow-up [phone]  

## 2016-02-02 ENCOUNTER — Other Ambulatory Visit: Payer: Self-pay | Admitting: Family Medicine

## 2016-02-02 LAB — VITAMIN D 25 HYDROXY (VIT D DEFICIENCY, FRACTURES): Vit D, 25-Hydroxy: 15 ng/mL — ABNORMAL LOW (ref 30–100)

## 2016-02-02 MED ORDER — VITAMIN D (ERGOCALCIFEROL) 1.25 MG (50000 UNIT) PO CAPS: 50000.0000 [IU] | ORAL_CAPSULE | ORAL | 1 refills | Status: AC

## 2016-02-02 NOTE — Progress Notes (Signed)
Start vit D Rx for 8 weeks, then 1000 iu daily

## 2016-02-03 NOTE — Assessment & Plan Note (Signed)
Turned loose by neurology years ago, maintained on current AEDs here at this office; reviewed previous levels, normal, and patient opted to skip those today, but does agree with checking LFTs; refills provided; return in 6 months; cautioned patient to never put himself in harm's way by swimming alone, getting in bathtub unsupervised, not getting up on ladder, etc, so as not to be in situation where he could drown or fall if seizure occurred; he agrees

## 2016-02-04 ENCOUNTER — Other Ambulatory Visit: Payer: Self-pay | Admitting: Family Medicine

## 2016-02-04 DIAGNOSIS — G40409 Other generalized epilepsy and epileptic syndromes, not intractable, without status epilepticus: Secondary | ICD-10-CM

## 2016-02-04 DIAGNOSIS — G40309 Generalized idiopathic epilepsy and epileptic syndromes, not intractable, without status epilepticus: Secondary | ICD-10-CM

## 2016-02-04 NOTE — Telephone Encounter (Signed)
I just approved 6 months of medicine on 02/01/16; please resolve with pharmacy

## 2016-02-04 NOTE — Telephone Encounter (Signed)
I just approved a 6 month supply a few days ago

## 2016-02-04 NOTE — Telephone Encounter (Signed)
Notified pharmacy.

## 2016-04-21 HISTORY — PX: WISDOM TOOTH EXTRACTION: SHX21

## 2016-06-08 ENCOUNTER — Encounter: Payer: Self-pay | Admitting: Family Medicine

## 2016-06-08 ENCOUNTER — Ambulatory Visit (INDEPENDENT_AMBULATORY_CARE_PROVIDER_SITE_OTHER): Payer: Managed Care, Other (non HMO) | Admitting: Family Medicine

## 2016-06-08 VITALS — BP 118/84 | HR 76 | Temp 98.7°F | Resp 14 | Wt 252.2 lb

## 2016-06-08 DIAGNOSIS — Z113 Encounter for screening for infections with a predominantly sexual mode of transmission: Secondary | ICD-10-CM | POA: Insufficient documentation

## 2016-06-08 DIAGNOSIS — L821 Other seborrheic keratosis: Secondary | ICD-10-CM | POA: Diagnosis not present

## 2016-06-08 NOTE — Patient Instructions (Addendum)
Just keep an eye on the mole on your shoulder; notify me if it grows quickly, bleeds, or becomes irritated and we can refer you to have it removed  Safe Sex Safe sex is about reducing the risk of giving or getting a sexually transmitted disease (STD). STDs are spread through sexual contact involving the genitals, mouth, or rectum. Some STDs can be cured and others cannot. Safe sex can also prevent unintended pregnancies.  WHAT ARE SOME SAFE SEX PRACTICES?  Limit your sexual activity to only one partner who is having sex with only you.  Talk to your partner about his or her past partners, past STDs, and drug use.  Use a condom every time you have sexual intercourse. This includes vaginal, oral, and anal sexual activity. Both females and males should wear condoms during oral sex. Only use latex or polyurethane condoms and water-based lubricants. Using petroleum-based lubricants or oils to lubricate a condom will weaken the condom and increase the chance that it will break. The condom should be in place from the beginning to the end of sexual activity. Wearing a condom reduces, but does not completely eliminate, your risk of getting or giving an STD. STDs can be spread by contact with infected body fluids and skin.  Get vaccinated for hepatitis B and HPV.  Avoid alcohol and recreational drugs, which can affect your judgment. You may forget to use a condom or participate in high-risk sex.  For females, avoid douching after sexual intercourse. Douching can spread an infection farther into the reproductive tract.  Check your body for signs of sores, blisters, rashes, or unusual discharge. See your health care provider if you notice any of these signs.  Avoid sexual contact if you have symptoms of an infection or are being treated for an STD. If you or your partner has herpes, avoid sexual contact when blisters are present. Use condoms at all other times.  If you are at risk of being infected with  HIV, it is recommended that you take a prescription medicine daily to prevent HIV infection. This is called pre-exposure prophylaxis (PrEP). You are considered at risk if:  You are a man who has sex with other men (MSM).  You are a heterosexual man or woman who is sexually active with more than one partner.  You take drugs by injection.  You are sexually active with a partner who has HIV.  Talk with your health care provider about whether you are at high risk of being infected with HIV. If you choose to begin PrEP, you should first be tested for HIV. You should then be tested every 3 months for as long as you are taking PrEP.  See your health care provider for regular screenings, exams, and tests for other STDs. Before having sex with a new partner, each of you should be screened for STDs and should talk about the results with each other. WHAT ARE THE BENEFITS OF SAFE SEX?   There is less chance of getting or giving an STD.  You can prevent unwanted or unintended pregnancies.  By discussing safe sex concerns with your partner, you may increase feelings of intimacy, comfort, trust, and honesty between the two of you. This information is not intended to replace advice given to you by your health care provider. Make sure you discuss any questions you have with your health care provider. Document Released: 06/08/2004 Document Revised: 05/22/2014 Document Reviewed: 03/21/2015 Elsevier Interactive Patient Education  2017 [employer]

## 2016-06-08 NOTE — Assessment & Plan Note (Addendum)
Screening tests ordered; safe sex, see AVS

## 2016-06-08 NOTE — Assessment & Plan Note (Signed)
Discussed benign condition, but notify me if growing quickly, bleeding or irritated

## 2016-06-08 NOTE — Progress Notes (Signed)
BP 118/84   Pulse 76   Temp 98.7 F (37.1 C) (Oral)   Resp 14   Wt 252 lb 4 oz (114.4 kg)   SpO2 98%   BMI 36.19 kg/m    Subjective:    Patient ID: Pedro [name], male    DOB: 05-08-1981, 36 y.o.   MRN: [id number]  HPI: Pedro [name] is a 36 y.o. male  Chief Complaint  Patient presents with  . Exposure to STD    STD check through blood work   . Nevus    on right shoulder    Patient is here for an acute visit; he would like testing for STDs He just broke up with his girlfriend last week;she was sleeping with her boss He is not having any symptoms; denies blisters, urethral discharge, groin lad, dysuria, etc.  Mole got so big that he can see it through his shirt It went from size of pencil eraser to a regular M&M over a year and a half; not bleeding, not painful; occasionally itches;   [facility] 2/9 06/08/2016 02/01/2016 08/04/2015 01/27/2015  Decreased Interest 0 0 0 0  Down, Depressed, Hopeless 0 0 0 0  PHQ - 2 Score 0 0 0 0   Relevant past medical, surgical, family and social history reviewed Past Medical History:  Diagnosis Date  . Arthritis   . Seizures (HCC)    Past Surgical History:  Procedure Laterality Date  . WISDOM TOOTH EXTRACTION  04/21/2016   Social History  Substance Use Topics  . Smoking status: Former Smoker    Years: 5.00    Types: Cigarettes    Start date: 05/16/1995    Quit date: 05/16/1999  . Smokeless tobacco: Never Used  . Alcohol use 0.0 oz/week   Interim medical history since last visit reviewed. Allergies and medications reviewed  Review of Systems Per HPI unless specifically indicated above     Objective:    BP 118/84   Pulse 76   Temp 98.7 F (37.1 C) (Oral)   Resp 14   Wt 252 lb 4 oz (114.4 kg)   SpO2 98%   BMI 36.19 kg/m   Wt Readings from Last 3 Encounters:  06/08/16 252 lb 4 oz (114.4 kg)  02/01/16 255 lb 11.2 oz (116 kg)  08/04/15 247 lb 8 oz (112.3 kg)    Physical Exam  Constitutional: He  appears well-developed and well-nourished. No distress.  Eyes: No scleral icterus.  Cardiovascular: Normal rate.   Pulmonary/Chest: Effort normal.  Neurological: He is alert.  Skin:  Smooth dark brown waxy keratotic lesion about the size and shape of a plain M&M on right anterior shoulder  Psychiatric: He has a normal mood and affect.      Assessment & Plan:   Problem List Items Addressed This Visit      Musculoskeletal and Integument   Seborrheic keratosis - Primary    Discussed benign condition, but notify me if growing quickly, bleeding or irritated        Other   Screening examination for STD (sexually transmitted disease)    Screening tests ordered; safe sex, see AVS      Relevant Orders   HIV antibody (Completed)   Hepatitis panel, acute (Completed)   RPR (Completed)   GC/Chlamydia Probe Amp (Completed)   Urine cytology ancillary only      Follow up plan: No Follow-up on file.  An after-visit summary was printed and given to the patient at check-out.  Please see the patient instructions which may contain other information and recommendations beyond what is mentioned above in the assessment and plan.  No orders of the defined types were placed in this encounter.   Orders Placed This Encounter  Procedures  . GC/Chlamydia Probe Amp  . HIV antibody  . Hepatitis panel, acute  . RPR

## 2016-06-09 LAB — HEPATITIS PANEL, ACUTE
HCV AB: NEGATIVE
HEP A IGM: NONREACTIVE
HEP B S AG: NEGATIVE
Hep B C IgM: NONREACTIVE

## 2016-06-09 LAB — GC/CHLAMYDIA PROBE AMP
CT Probe RNA: NOT DETECTED
GC Probe RNA: NOT DETECTED

## 2016-06-09 LAB — RPR

## 2016-06-09 LAB — HIV ANTIBODY (ROUTINE TESTING W REFLEX): HIV 1&2 Ab, 4th Generation: NONREACTIVE

## 2016-06-09 LAB — TRICHOMONAS VAGINALIS RNA, QL,MALES: TRICHOMONAS VAGINALIS RNA: NOT DETECTED

## 2016-07-31 ENCOUNTER — Ambulatory Visit (INDEPENDENT_AMBULATORY_CARE_PROVIDER_SITE_OTHER): Payer: 59 | Admitting: Family Medicine

## 2016-07-31 ENCOUNTER — Encounter: Payer: Self-pay | Admitting: Family Medicine

## 2016-07-31 DIAGNOSIS — E782 Mixed hyperlipidemia: Secondary | ICD-10-CM

## 2016-07-31 DIAGNOSIS — E559 Vitamin D deficiency, unspecified: Secondary | ICD-10-CM

## 2016-07-31 DIAGNOSIS — G40309 Generalized idiopathic epilepsy and epileptic syndromes, not intractable, without status epilepticus: Secondary | ICD-10-CM

## 2016-07-31 DIAGNOSIS — E785 Hyperlipidemia, unspecified: Secondary | ICD-10-CM | POA: Insufficient documentation

## 2016-07-31 DIAGNOSIS — Z5181 Encounter for therapeutic drug level monitoring: Secondary | ICD-10-CM | POA: Diagnosis not present

## 2016-07-31 DIAGNOSIS — G40409 Other generalized epilepsy and epileptic syndromes, not intractable, without status epilepticus: Secondary | ICD-10-CM

## 2016-07-31 LAB — COMPLETE METABOLIC PANEL WITH GFR
ALBUMIN: 4.6 g/dL (ref 3.6–5.1)
ALK PHOS: 87 U/L (ref 40–115)
ALT: 17 U/L (ref 9–46)
AST: 17 U/L (ref 10–40)
BILIRUBIN TOTAL: 0.4 mg/dL (ref 0.2–1.2)
BUN: 12 mg/dL (ref 7–25)
CO2: 26 mmol/L (ref 20–31)
CREATININE: 1 mg/dL (ref 0.60–1.35)
Calcium: 9.9 mg/dL (ref 8.6–10.3)
Chloride: 103 mmol/L (ref 98–110)
GFR, Est African American: >89 mL/min (ref >=60)
GFR, Est Non African American: >89 mL/min (ref >=60)
Glucose, Bld: 88 mg/dL (ref 65–99)
Potassium: 4.5 mmol/L (ref 3.5–5.3)
Sodium: 141 mmol/L (ref 135–146)
TOTAL PROTEIN: 7.7 g/dL (ref 6.1–8.1)

## 2016-07-31 LAB — LIPID PANEL
CHOL/HDL RATIO: 3 ratio (ref <5.0)
CHOLESTEROL: 237 mg/dL — AB (ref <200)
HDL: 78 mg/dL (ref >40)
LDL Cholesterol: 144 mg/dL — ABNORMAL HIGH (ref <100)
Triglycerides: 76 mg/dL (ref <150)
VLDL: 15 mg/dL (ref <30)

## 2016-07-31 MED ORDER — DEPAKOTE ER 500 MG PO TB24: 1000.0000 mg | ORAL_TABLET | Freq: Every day | ORAL | 6 refills | Status: DC

## 2016-07-31 MED ORDER — CARBATROL 200 MG PO CP12: ORAL_CAPSULE | ORAL | 6 refills | Status: DC

## 2016-07-31 NOTE — Progress Notes (Signed)
BP 118/82   Pulse 77   Temp 98 F (36.7 C) (Oral)   Resp 16   Wt 247 lb 1 oz (112.1 kg)   SpO2 97%   BMI 35.45 kg/m    Subjective:    Patient ID: Pedro [name], [name]    DOB: [birth year], 36 y.o.   MRN: [id number]  HPI: Pedro [name] is a 36 y.o. [name]  Chief Complaint  Patient presents with  . Medication Refill   Since last visit, no medical excitement  Vitamin D low; works straight nights; level was 11.7 and up to 15; no vitamins; energy level is doing okay  High cholesterol; last LDL was 150; total was 243; poor eating habits previously; maternal grandmother had a heart attack and stroke, mother had a stroke  Lost weight from stress  HTN runs in the family  Epilepsy, last seizure was 2005, induced in the hospital without medicine; he needs refills of his medicines today  [facility] 07/31/2016 06/08/2016 02/01/2016 08/04/2015 01/27/2015  Decreased Interest 0 0 0 0 0  Down, Depressed, Hopeless 0 0 0 0 0  PHQ - 2 Score 0 0 0 0 0   Relevant past medical, surgical, family and social history reviewed Past Medical History:  Diagnosis Date  . Arthritis   . Seizures (HCC)    Past Surgical History:  Procedure Laterality Date  . WISDOM TOOTH EXTRACTION  04/21/2016   Family History  Problem Relation Age of Onset  . Seizures Mother   . Hypertension Mother   . Hyperlipidemia Father   . Hypertension Father   . Hypertension Sister   . Depression Brother    Social History  Substance Use Topics  . Smoking status: Former Smoker    Years: 5.00    Types: Cigarettes    Start date: 05/16/1995    Quit date: 05/16/1999  . Smokeless tobacco: Never Used  . Alcohol use 0.0 oz/week   Interim medical history since last visit reviewed. Allergies and medications reviewed  Review of Systems Per HPI unless specifically indicated above     Objective:    BP 118/82   Pulse 77   Temp 98 F (36.7 C) (Oral)   Resp 16   Wt 247 lb 1 oz (112.1 kg)   SpO2 97%    BMI 35.45 kg/m   Wt Readings from Last 3 Encounters:  07/31/16 247 lb 1 oz (112.1 kg)  06/08/16 252 lb 4 oz (114.4 kg)  02/01/16 255 lb 11.2 oz (116 kg)    Physical Exam  Constitutional: He appears well-developed and well-nourished. No distress.  Obese, weight loss noted  Eyes: No scleral icterus.  Cardiovascular: Normal rate and regular rhythm.   Pulmonary/Chest: Effort normal and breath sounds normal.  Neurological: He is alert.  Skin:  Tattoo volar aspect left forearm  Psychiatric: He has a normal mood and affect. His mood appears not anxious. He does not exhibit a depressed mood.    Results for orders placed or performed in visit on 06/08/16  GC/Chlamydia Probe Amp  Result Value Ref Range   CT Probe RNA NOT DETECTED    GC Probe RNA NOT DETECTED   HIV antibody  Result Value Ref Range   HIV 1&2 Ab, 4th Generation NONREACTIVE NONREACTIVE  Hepatitis panel, acute  Result Value Ref Range   Hepatitis B Surface Ag NEGATIVE NEGATIVE   HCV Ab NEGATIVE NEGATIVE   Hep B C IgM NON REACTIVE NON REACTIVE   Hep A  IgM NON REACTIVE NON REACTIVE  RPR  Result Value Ref Range   RPR Ser Ql NON REAC NON REAC  Trichomonas vaginalis RNA, Ql,Males  Result Value Ref Range   Trichomonas vaginalis RNA Not Detected Not Detected      Assessment & Plan:   Problem List Items Addressed This Visit      Other   Vitamin D deficiency    Check level today and replace if needed      Relevant Orders   VITAMIN D 25 Hydroxy (Vit-D Deficiency, Fractures)   Hyperlipidemia    Check lipids today; try to limit saturated fats      Relevant Orders   Lipid panel   Encounter for medication monitoring    Check labs      Relevant Orders   COMPLETE METABOLIC PANEL WITH GFR   Admission for therapeutic drug monitoring    Check CBZ and VPA      Relevant Orders   Valproic Acid level   Carbamazepine Level (Tegretol), total    Other Visit Diagnoses    Epilepsy, 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  An after-visit summary was printed and given to the patient at check-out.  Please see the patient instructions which may contain other information and recommendations beyond what is mentioned above in the assessment and plan.  Meds ordered this encounter  Medications  . DEPAKOTE ER 500 MG 24 hr tablet    Sig: Take 2 tablets (1,000 mg total) by mouth at bedtime.    Dispense:  60 tablet    Refill:  6    Brand name medically necessary, dispense as written  . CARBATROL 200 MG 12 hr capsule    Sig: Take 2 capsules by mouth in the morning and 3 capsules in the evening    Dispense:  150 capsule    Refill:  6    Brand name same brand as before only, medically neccessary    Orders Placed This Encounter  Procedures  . Lipid panel  . COMPLETE METABOLIC PANEL WITH GFR  . Valproic Acid level  . Carbamazepine Level (Tegretol), total  . VITAMIN D 25 Hydroxy (Vit-D Deficiency, Fractures)

## 2016-07-31 NOTE — Assessment & Plan Note (Signed)
Check lipids today; try to limit saturated fats 

## 2016-07-31 NOTE — Patient Instructions (Addendum)
Try to limit saturated fats in your diet (bologna, hot dogs, barbeque, cheeseburgers, hamburgers, steak, bacon, sausage, cheese, etc.) and get more fresh fruits, vegetables, and whole grains We'll get labs today Return in a little over 6 months Cholesterol Cholesterol is a fat. Your body needs a small amount of cholesterol. Cholesterol (plaque) may build up in your blood vessels (arteries). That makes you more likely to have a heart attack or stroke. You cannot feel your cholesterol level. Having a blood test is the only way to find out if your level is high. Keep your test results. Work with your doctor to keep your cholesterol at a good level. What do the results mean?  Total cholesterol is how much cholesterol is in your blood.  LDL is bad cholesterol. This is the type that can build up. Try to have low LDL.  HDL is good cholesterol. It cleans your blood vessels and carries LDL away. Try to have high HDL.  Triglycerides are fat that the body can store or burn for energy. What are good levels of cholesterol?  Total cholesterol below 200.  LDL below 100 is good for people who have health risks. LDL below 70 is good for people who have very high risks.  HDL above 40 is good. It is best to have HDL of 60 or higher.  Triglycerides below 150. How can I lower my cholesterol? Diet  Follow your diet program as told by your doctor.  Choose fish, white meat chicken, or [place] that is roasted or baked. Try not to eat red meat, fried foods, sausage, or lunch meats.  Eat lots of fresh fruits and vegetables.  Choose whole grains, beans, pasta, potatoes, and cereals.  Choose olive oil, corn oil, or canola oil. Only use small amounts.  Try not to eat butter, mayonnaise, shortening, or palm kernel oils.  Try not to eat foods with trans fats.  Choose low-fat or nonfat dairy foods.  Drink skim or nonfat milk.  Eat low-fat or nonfat yogurt and cheeses.  Try not to drink whole milk or  cream.  Try not to eat ice cream, egg yolks, or full-fat cheeses.  Healthy desserts include angel food cake, ginger snaps, animal crackers, hard candy, popsicles, and low-fat or nonfat frozen yogurt. Try not to eat pastries, cakes, pies, and cookies. Exercise  Follow your exercise program as told by your doctor.  Be more active. Try gardening, walking, and taking the stairs.  Ask your doctor about ways that you can be more active. Medicine  Take over-the-counter and prescription medicines only as told by your doctor. This information is not intended to replace advice given to you by your health care provider. Make sure you discuss any questions you have with your health care provider. Document Released: 07/28/2008 Document Revised: 12/01/2015 Document Reviewed: 11/11/2015 Elsevier Interactive Patient Education  2017 [employer]

## 2016-07-31 NOTE — Assessment & Plan Note (Signed)
Check labs 

## 2016-07-31 NOTE — Assessment & Plan Note (Signed)
Check CBZ and VPA

## 2016-07-31 NOTE — Assessment & Plan Note (Signed)
Check level today and replace if needed 

## 2016-08-01 LAB — CARBAMAZEPINE LEVEL, TOTAL: Carbamazepine Lvl: 9.9 mg/L (ref 4.0–12.0)

## 2016-08-01 LAB — VALPROIC ACID LEVEL: VALPROIC ACID LVL: 60.5 ug/mL (ref 50.0–100.0)

## 2016-08-01 LAB — VITAMIN D 25 HYDROXY (VIT D DEFICIENCY, FRACTURES): VIT D 25 HYDROXY: 11 ng/mL — AB (ref 30–100)

## 2016-08-02 ENCOUNTER — Other Ambulatory Visit: Payer: Self-pay | Admitting: Family Medicine

## 2016-08-02 MED ORDER — VITAMIN D (ERGOCALCIFEROL) 1.25 MG (50000 UNIT) PO CAPS: 50000.0000 [IU] | ORAL_CAPSULE | ORAL | 1 refills | Status: DC

## 2016-08-02 NOTE — Progress Notes (Signed)
Rx for vit D sent; once a week for 8 weeks, then once a month

## 2016-08-07 ENCOUNTER — Telehealth: Payer: Self-pay

## 2016-08-07 NOTE — Telephone Encounter (Signed)
Contact pt to go over his lab results, no luck and pt voicemail box is full. This would be my second attempt. I will send out a letter and lab results.

## 2016-09-10 ENCOUNTER — Other Ambulatory Visit: Payer: Self-pay | Admitting: Family Medicine

## 2017-02-15 ENCOUNTER — Other Ambulatory Visit: Payer: Self-pay

## 2017-02-15 DIAGNOSIS — G40409 Other generalized epilepsy and epileptic syndromes, not intractable, without status epilepticus: Secondary | ICD-10-CM

## 2017-02-15 DIAGNOSIS — G40309 Generalized idiopathic epilepsy and epileptic syndromes, not intractable, without status epilepticus: Secondary | ICD-10-CM

## 2017-02-15 MED ORDER — DEPAKOTE ER 500 MG PO TB24: 1000.0000 mg | ORAL_TABLET | Freq: Every day | ORAL | 6 refills | Status: DC

## 2017-02-21 ENCOUNTER — Encounter: Payer: Self-pay | Admitting: Family Medicine

## 2017-02-21 ENCOUNTER — Ambulatory Visit (INDEPENDENT_AMBULATORY_CARE_PROVIDER_SITE_OTHER): Payer: 59 | Admitting: Family Medicine

## 2017-02-21 DIAGNOSIS — E559 Vitamin D deficiency, unspecified: Secondary | ICD-10-CM

## 2017-02-21 DIAGNOSIS — G40309 Generalized idiopathic epilepsy and epileptic syndromes, not intractable, without status epilepticus: Secondary | ICD-10-CM | POA: Diagnosis not present

## 2017-02-21 DIAGNOSIS — Z5181 Encounter for therapeutic drug level monitoring: Secondary | ICD-10-CM | POA: Diagnosis not present

## 2017-02-21 DIAGNOSIS — E669 Obesity, unspecified: Secondary | ICD-10-CM

## 2017-02-21 DIAGNOSIS — G40409 Other generalized epilepsy and epileptic syndromes, not intractable, without status epilepticus: Secondary | ICD-10-CM

## 2017-02-21 DIAGNOSIS — E782 Mixed hyperlipidemia: Secondary | ICD-10-CM | POA: Diagnosis not present

## 2017-02-21 DIAGNOSIS — G40802 Other epilepsy, not intractable, without status epilepticus: Secondary | ICD-10-CM | POA: Diagnosis not present

## 2017-02-21 MED ORDER — CARBATROL 200 MG PO CP12: ORAL_CAPSULE | ORAL | 6 refills | Status: DC

## 2017-02-21 NOTE — Patient Instructions (Addendum)
I do recommend yearly flu shots; for individuals who don't want flu shots, try to practice excellent hand hygiene, and avoid nursing homes, day cares, and hospitals during peak flu season; taking additional vitamin C daily during flu/cold season may help boost your immune system too  Try to follow the DASH guidelines (DASH stands for Dietary Approaches to Stop Hypertension) Try to limit the sodium in your diet.  Ideally, consume less than 1.5 grams (less than 1,500mg ) per day. Do not add salt when cooking or at the table.  Check the sodium amount on labels when shopping, and choose items lower in sodium when given a choice. Avoid or limit foods that already contain a lot of sodium. Eat a diet rich in fruits and vegetables and whole grains.   DASH Eating Plan DASH stands for "Dietary Approaches to Stop Hypertension." The DASH eating plan is a healthy eating plan that has been shown to reduce high blood pressure (hypertension). It may also reduce your risk for type 2 diabetes, heart disease, and stroke. The DASH eating plan may also help with weight loss. What are tips for following this plan? General guidelines  Avoid eating more than 2,300 mg (milligrams) of salt (sodium) a day. If you have hypertension, you may need to reduce your sodium intake to 1,500 mg a day.  Limit alcohol intake to no more than 1 drink a day for nonpregnant women and 2 drinks a day for men. One drink equals 12 oz of beer, 5 oz of wine, or 1 oz of hard liquor.  Work with your health care provider to maintain a healthy body weight or to lose weight. Ask what an ideal weight is for you.  Get at least 30 minutes of exercise that causes your heart to beat faster (aerobic exercise) most days of the week. Activities may include walking, swimming, or biking.  Work with your health care provider or diet and nutrition specialist (dietitian) to adjust your eating plan to your individual calorie needs. Reading food labels  Check  food labels for the amount of sodium per serving. Choose foods with less than 5 percent of the Daily Value of sodium. Generally, foods with less than 300 mg of sodium per serving fit into this eating plan.  To find whole grains, look for the word "whole" as the first word in the ingredient list. Shopping  Buy products labeled as "low-sodium" or "no salt added."  Buy fresh foods. Avoid canned foods and premade or frozen meals. Cooking  Avoid adding salt when cooking. Use salt-free seasonings or herbs instead of table salt or sea salt. Check with your health care provider or pharmacist before using salt substitutes.  Do not fry foods. Cook foods using healthy methods such as baking, boiling, grilling, and broiling instead.  Cook with heart-healthy oils, such as olive, canola, soybean, or sunflower oil. Meal planning   Eat a balanced diet that includes: ? 5 or more servings of fruits and vegetables each day. At each meal, try to fill half of your plate with fruits and vegetables. ? Up to 6-8 servings of whole grains each day. ? Less than 6 oz of lean meat, poultry, or fish each day. A 3-oz serving of meat is about the same size as a deck of cards. One egg equals 1 oz. ? 2 servings of low-fat dairy each day. ? A serving of nuts, seeds, or beans 5 times each week. ? Heart-healthy fats. Healthy fats called Omega-3 fatty acids are found in  foods such as flaxseeds and coldwater fish, like sardines, salmon, and mackerel.  Limit how much you eat of the following: ? Canned or prepackaged foods. ? Food that is high in trans fat, such as fried foods. ? Food that is high in saturated fat, such as fatty meat. ? Sweets, desserts, sugary drinks, and other foods with added sugar. ? Full-fat dairy products.  Do not salt foods before eating.  Try to eat at least 2 vegetarian meals each week.  Eat more home-cooked food and less restaurant, buffet, and fast food.  When eating at a restaurant, ask  that your food be prepared with less salt or no salt, if possible. What foods are recommended? The items listed may not be a complete list. Talk with your dietitian about what dietary choices are best for you. Grains Whole-grain or whole-wheat bread. Whole-grain or whole-wheat pasta. Brown rice. Orpah Cobb. Bulgur. Whole-grain and low-sodium cereals. Pita bread. Low-fat, low-sodium crackers. Whole-wheat flour tortillas. Vegetables Fresh or frozen vegetables (raw, steamed, roasted, or grilled). Low-sodium or reduced-sodium tomato and vegetable juice. Low-sodium or reduced-sodium tomato sauce and tomato paste. Low-sodium or reduced-sodium canned vegetables. Fruits All fresh, dried, or frozen fruit. Canned fruit in natural juice (without added sugar). Meat and other protein foods Skinless chicken or [place]. Ground chicken or [place]. Pork with fat trimmed off. Fish and seafood. Egg whites. Dried beans, peas, or lentils. Unsalted nuts, nut butters, and seeds. Unsalted canned beans. Lean cuts of beef with fat trimmed off. Low-sodium, lean deli meat. Dairy Low-fat (1%) or fat-free (skim) milk. Fat-free, low-fat, or reduced-fat cheeses. Nonfat, low-sodium ricotta or cottage cheese. Low-fat or nonfat yogurt. Low-fat, low-sodium cheese. Fats and oils Soft margarine without trans fats. Vegetable oil. Low-fat, reduced-fat, or light mayonnaise and salad dressings (reduced-sodium). Canola, safflower, olive, soybean, and sunflower oils. Avocado. Seasoning and other foods Herbs. Spices. Seasoning mixes without salt. Unsalted popcorn and pretzels. Fat-free sweets. What foods are not recommended? The items listed may not be a complete list. Talk with your dietitian about what dietary choices are best for you. Grains Baked goods made with fat, such as croissants, muffins, or some breads. Dry pasta or rice meal packs. Vegetables Creamed or fried vegetables. Vegetables in a cheese sauce. Regular canned  vegetables (not low-sodium or reduced-sodium). Regular canned tomato sauce and paste (not low-sodium or reduced-sodium). Regular tomato and vegetable juice (not low-sodium or reduced-sodium). Rosita Fire. Olives. Fruits Canned fruit in a light or heavy syrup. Fried fruit. Fruit in cream or butter sauce. Meat and other protein foods Fatty cuts of meat. Ribs. Fried meat. Tomasa Blase. Sausage. Bologna and other processed lunch meats. Salami. Fatback. Hotdogs. Bratwurst. Salted nuts and seeds. Canned beans with added salt. Canned or smoked fish. Whole eggs or egg yolks. Chicken or [place] with skin. Dairy Whole or 2% milk, cream, and half-and-half. Whole or full-fat cream cheese. Whole-fat or sweetened yogurt. Full-fat cheese. Nondairy creamers. Whipped toppings. Processed cheese and cheese spreads. Fats and oils Butter. Stick margarine. Lard. Shortening. Ghee. Bacon fat. Tropical oils, such as coconut, palm kernel, or palm oil. Seasoning and other foods Salted popcorn and pretzels. Onion salt, garlic salt, seasoned salt, table salt, and sea salt. Worcestershire sauce. Tartar sauce. Barbecue sauce. Teriyaki sauce. Soy sauce, including reduced-sodium. Steak sauce. Canned and packaged gravies. Fish sauce. Oyster sauce. Cocktail sauce. Horseradish that you find on the shelf. Ketchup. Mustard. Meat flavorings and tenderizers. Bouillon cubes. Hot sauce and Tabasco sauce. Premade or packaged marinades. Premade or packaged taco seasonings. Relishes. Regular  salad dressings. Where to find more information:  National Heart, Lung, and Blood Institute: PopSteam.is  American Heart Association: www.heart.org Summary  The DASH eating plan is a healthy eating plan that has been shown to reduce high blood pressure (hypertension). It may also reduce your risk for type 2 diabetes, heart disease, and stroke.  With the DASH eating plan, you should limit salt (sodium) intake to 2,300 mg a day. If you have hypertension, you  may need to reduce your sodium intake to 1,500 mg a day.  When on the DASH eating plan, aim to eat more fresh fruits and vegetables, whole grains, lean proteins, low-fat dairy, and heart-healthy fats.  Work with your health care provider or diet and nutrition specialist (dietitian) to adjust your eating plan to your individual calorie needs. This information is not intended to replace advice given to you by your health care provider. Make sure you discuss any questions you have with your health care provider. Document Released: 04/20/2011 Document Revised: 04/24/2016 Document Reviewed: 04/24/2016 Elsevier Interactive Patient Education  2017 [employer].

## 2017-02-21 NOTE — Assessment & Plan Note (Signed)
Stable, continue medicine 

## 2017-02-21 NOTE — Assessment & Plan Note (Signed)
Monitor levels

## 2017-02-21 NOTE — Assessment & Plan Note (Signed)
Work on weight loss, check TSH

## 2017-02-21 NOTE — Progress Notes (Signed)
BP 136/78 (BP Location: Left Arm, Patient Position: Sitting, Cuff Size: Normal)   Pulse 83   Temp 98.3 F (36.8 C) (Oral)   Resp 16   Wt 257 lb 9.6 oz (116.8 kg)   SpO2 98%   BMI 36.96 kg/m    Subjective:    Patient ID: Pedro [name], male    DOB: 07-Sep-1980, 36 y.o.   MRN: [id number]  HPI: Pedro [name] is a 36 y.o. male  Chief Complaint  Patient presents with  . Medication Refill    depakote and carbatrol    HPI Patient is here for f/u Does not want flu shot  No seizures recently; on medicine; last was Feb 2005; mother and brother also with epilepsy Last was actually triggered in the hospital  BP a little up today; pork rinds maybe ran it up; avoids salty foods, like hot dogs, smoked sausage; at [facility] around 11 pm, spicy chicken sandwich, double stack, double cheeseburgers; fries too; [employer] yesterday afternoon  He is eating fewer eggs; high cholesterol last draw; he'd like to see what it is now  Vit D deficiency; finished the Rx and has gotten more sun  Herniated disc, not bothersome, not taking NSAID any more  Depression screen [facility] 2/9 02/21/2017 07/31/2016 06/08/2016 02/01/2016 08/04/2015  Decreased Interest 0 0 0 0 0  Down, Depressed, Hopeless 0 0 0 0 0  PHQ - 2 Score 0 0 0 0 0    Relevant past medical, surgical, family and social history reviewed Past Medical History:  Diagnosis Date  . Arthritis   . Seizures (HCC)    Past Surgical History:  Procedure Laterality Date  . WISDOM TOOTH EXTRACTION  04/21/2016   Family History  Problem Relation Age of Onset  . Seizures Mother   . Hypertension Mother   . Hyperlipidemia Father   . Hypertension Father   . Hypertension Sister   . Depression Brother    Social History   Social History  . Marital status: Single    Spouse name: N/A  . Number of children: N/A  . Years of education: N/A   Occupational History  . Not on file.   Social History Main Topics  . Smoking status: Former Smoker   Years: 5.00    Types: Cigarettes    Start date: 05/16/1995    Quit date: 05/16/1999  . Smokeless tobacco: Never Used  . Alcohol use 1.2 - 1.8 oz/week    2 - 3 Cans of beer per week     Comment:  maybe 2-3 cans of beer a day  . Drug use: No  . Sexual activity: Yes    Partners: Female    Birth control/ protection: Condom   Other Topics Concern  . Not on file   Social History Narrative  . No narrative on file    Interim medical history since last visit reviewed. Allergies and medications reviewed  Review of Systems  Constitutional: Positive for unexpected weight change. Negative for appetite change.  Gastrointestinal: Negative for abdominal pain.   Per HPI unless specifically indicated above     Objective:    BP 136/78 (BP Location: Left Arm, Patient Position: Sitting, Cuff Size: Normal)   Pulse 83   Temp 98.3 F (36.8 C) (Oral)   Resp 16   Wt 257 lb 9.6 oz (116.8 kg)   SpO2 98%   BMI 36.96 kg/m   Wt Readings from Last 3 Encounters:  02/21/17 257 lb 9.6 oz (116.8 kg)  07/31/16 247 lb 1 oz (112.1 kg)  06/08/16 252 lb 4 oz (114.4 kg)    Physical Exam  Constitutional: He appears well-developed and well-nourished. No distress.  Obese, weight loss noted  Eyes: No scleral icterus.  Cardiovascular: Normal rate and regular rhythm.   Pulmonary/Chest: Effort normal and breath sounds normal.  Abdominal: Normal appearance and bowel sounds are normal. He exhibits no distension.  Neurological: He is alert. He displays no tremor.  Skin: He is not diaphoretic. No pallor.  Two tattoos on each forearm  Psychiatric: He has a normal mood and affect. His mood appears not anxious. He does not exhibit a depressed mood.    Results for orders placed or performed in visit on 07/31/16  Lipid panel  Result Value Ref Range   Cholesterol 237 (H) <200 mg/dL   Triglycerides 76 <098 mg/dL   HDL 78 >11 mg/dL   Total CHOL/HDL Ratio 3.0 <5.0 Ratio   VLDL 15 <30 mg/dL   LDL Cholesterol 914 (H)  <100 mg/dL  COMPLETE METABOLIC PANEL WITH GFR  Result Value Ref Range   Sodium 141 135 - 146 mmol/L   Potassium 4.5 3.5 - 5.3 mmol/L   Chloride 103 98 - 110 mmol/L   CO2 26 20 - 31 mmol/L   Glucose, Bld 88 65 - 99 mg/dL   BUN 12 7 - 25 mg/dL   Creat 7.82 9.56 - 2.13 mg/dL   Total Bilirubin 0.4 0.2 - 1.2 mg/dL   Alkaline Phosphatase 87 40 - 115 U/L   AST 17 10 - 40 U/L   ALT 17 9 - 46 U/L   Total Protein 7.7 6.1 - 8.1 g/dL   Albumin 4.6 3.6 - 5.1 g/dL   Calcium 9.9 8.6 - 08.6 mg/dL   GFR, Est African American >89 >=60 mL/min   GFR, Est Non African American >89 >=60 mL/min  Valproic Acid level  Result Value Ref Range   Valproic Acid Lvl 60.5 50.0 - 100.0 ug/mL  Carbamazepine Level (Tegretol), total  Result Value Ref Range   Carbamazepine Lvl 9.9 4.0 - 12.0 mg/L  VITAMIN D 25 Hydroxy (Vit-D Deficiency, Fractures)  Result Value Ref Range   Vit D, 25-Hydroxy 11 (L) 30 - 100 ng/mL      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)    Stable, continue medicine      Relevant Medications   CARBATROL 200 MG 12 hr capsule   Other Relevant Orders   Carbamazepine Level (Tegretol), total   Valproic Acid level     Other   Vitamin D deficiency    Check level today; already completed the RX and got more sun      Relevant Orders   VITAMIN D 25 Hydroxy (Vit-D Deficiency, Fractures)   Obesity (BMI 35.0-39.9 without comorbidity)    Work on weight loss, check TSH      Relevant Orders   TSH   Hyperlipidemia    Encouraged low sat fats diet, recheck lipids      Relevant Orders   Lipid panel   Admission for therapeutic drug monitoring    Monitor levels      Relevant Orders   COMPLETE METABOLIC PANEL WITH GFR   Carbamazepine Level (Tegretol), total   Valproic Acid level    Other Visit Diagnoses    Epilepsy, 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  An after-visit summary was printed and given to the patient at check-out.  Please see the patient instructions which may contain other information and recommendations beyond what is mentioned above in the assessment and plan.  Meds ordered this encounter  Medications  . CARBATROL 200 MG 12 hr capsule    Sig: Take 2 capsules by mouth in the morning and 3 capsules in the evening    Dispense:  150 capsule    Refill:  6    Brand name same brand as before only, medically neccessary    Orders Placed This Encounter  Procedures  . COMPLETE METABOLIC PANEL WITH GFR  . Lipid panel  . TSH  . VITAMIN D 25 Hydroxy (Vit-D Deficiency, Fractures)  . Carbamazepine Level (Tegretol), total  . Valproic Acid level

## 2017-02-21 NOTE — Assessment & Plan Note (Signed)
Encouraged low sat fats diet, recheck lipids

## 2017-02-21 NOTE — Assessment & Plan Note (Signed)
Check level today; already completed the RX and got more sun

## 2017-02-22 ENCOUNTER — Other Ambulatory Visit: Payer: Self-pay | Admitting: Family Medicine

## 2017-02-22 LAB — COMPLETE METABOLIC PANEL WITH GFR
AG RATIO: 1.4 (calc) (ref 1.0–2.5)
ALT: 20 U/L (ref 9–46)
AST: 18 U/L (ref 10–40)
Albumin: 4.4 g/dL (ref 3.6–5.1)
Alkaline phosphatase (APISO): 75 U/L (ref 40–115)
BILIRUBIN TOTAL: 0.4 mg/dL (ref 0.2–1.2)
BUN: 16 mg/dL (ref 7–25)
CHLORIDE: 103 mmol/L (ref 98–110)
CO2: 30 mmol/L (ref 20–32)
Calcium: 9.5 mg/dL (ref 8.6–10.3)
Creat: 0.92 mg/dL (ref 0.60–1.35)
GFR, Est African American: 122 mL/min/{1.73_m2} (ref >=60)
GFR, Est Non African American: 105 mL/min/{1.73_m2} (ref >=60)
Globulin: 3.1 g/dL (calc) (ref 1.9–3.7)
Glucose, Bld: 87 mg/dL (ref 65–99)
Potassium: 4.5 mmol/L (ref 3.5–5.3)
SODIUM: 140 mmol/L (ref 135–146)
Total Protein: 7.5 g/dL (ref 6.1–8.1)

## 2017-02-22 LAB — LIPID PANEL
CHOLESTEROL: 250 mg/dL — AB (ref <200)
HDL: 86 mg/dL (ref >40)
LDL Cholesterol (Calc): 148 mg/dL (calc) — ABNORMAL HIGH
Non-HDL Cholesterol (Calc): 164 mg/dL (calc) — ABNORMAL HIGH (ref <130)
Total CHOL/HDL Ratio: 2.9 (calc) (ref <5.0)
Triglycerides: 68 mg/dL (ref <150)

## 2017-02-22 LAB — VITAMIN D 25 HYDROXY (VIT D DEFICIENCY, FRACTURES): Vit D, 25-Hydroxy: 13 ng/mL — ABNORMAL LOW (ref 30–100)

## 2017-02-22 LAB — VALPROIC ACID LEVEL: VALPROIC ACID LVL: 74.1 mg/L (ref 50.0–100.0)

## 2017-02-22 LAB — TSH: TSH: 2.25 mIU/L (ref 0.40–4.50)

## 2017-02-22 LAB — CARBAMAZEPINE LEVEL, TOTAL: Carbamazepine Lvl: 11.5 mg/L (ref 4.0–12.0)

## 2017-02-22 MED ORDER — VITAMIN D (ERGOCALCIFEROL) 1.25 MG (50000 UNIT) PO CAPS: 50000.0000 [IU] | ORAL_CAPSULE | ORAL | 1 refills | Status: AC

## 2017-09-20 ENCOUNTER — Encounter: Payer: Self-pay | Admitting: Family Medicine

## 2017-09-20 ENCOUNTER — Ambulatory Visit: Payer: 59 | Admitting: Family Medicine

## 2017-09-20 DIAGNOSIS — H1010 Acute atopic conjunctivitis, unspecified eye: Secondary | ICD-10-CM | POA: Insufficient documentation

## 2017-09-20 DIAGNOSIS — H1013 Acute atopic conjunctivitis, bilateral: Secondary | ICD-10-CM

## 2017-09-20 DIAGNOSIS — E782 Mixed hyperlipidemia: Secondary | ICD-10-CM | POA: Diagnosis not present

## 2017-09-20 DIAGNOSIS — G40802 Other epilepsy, not intractable, without status epilepticus: Secondary | ICD-10-CM

## 2017-09-20 DIAGNOSIS — G40409 Other generalized epilepsy and epileptic syndromes, not intractable, without status epilepticus: Secondary | ICD-10-CM

## 2017-09-20 DIAGNOSIS — G40309 Generalized idiopathic epilepsy and epileptic syndromes, not intractable, without status epilepticus: Secondary | ICD-10-CM | POA: Diagnosis not present

## 2017-09-20 MED ORDER — OLOPATADINE HCL 0.2 % OP SOLN: 1.0000 [drp] | Freq: Every day | OPHTHALMIC | 11 refills | Status: DC

## 2017-09-20 MED ORDER — DEPAKOTE ER 500 MG PO TB24: 1000.0000 mg | ORAL_TABLET | Freq: Every day | ORAL | 5 refills | Status: DC

## 2017-09-20 MED ORDER — CARBATROL 200 MG PO CP12: ORAL_CAPSULE | ORAL | 5 refills | Status: DC

## 2017-09-20 NOTE — Progress Notes (Signed)
BP 118/74   Pulse 66   Temp 98.6 F (37 C) (Oral)   Resp 14   Ht  (1.778 m)   Wt 237 lb 11.2 oz (107.8 kg)   SpO2 99%   BMI 34.11 kg/m    Subjective:    Patient ID: Pedro [name], male    DOB: [birth year], 37 y.o.   MRN: [id number]  HPI: Pedro [name] is a 37 y.o. male  Chief Complaint  Patient presents with  . Follow-up  . Medication Refill    HPI Patient is here for f/u Epilepsy; on two AEDs; last seizure was in 2005, induced; I asked about length of need; patient does not want to stop medicines; last two levels in October High cholesterol; last LDL 148 Obesity; he has lost 20 pounds; eating peanut butter, nuts, cheeses, fruits; no hot dogs or bologna, no more pizza; only fried food is chicken tenders; no fries; trying to limit carbs, english muffin instead of biscuits; boiled eggs instead of fried eggs  Depression screen [facility] 2/9 09/20/2017 02/21/2017 07/31/2016 06/08/2016 02/01/2016  Decreased Interest 0 0 0 0 0  Down, Depressed, Hopeless 0 0 0 0 0  PHQ - 2 Score 0 0 0 0 0    Relevant past medical, surgical, family and social history reviewed Past Medical History:  Diagnosis Date  . Arthritis   . Seizures (HCC)    Past Surgical History:  Procedure Laterality Date  . WISDOM TOOTH EXTRACTION  04/21/2016   Family History  Problem Relation Age of Onset  . Seizures Mother   . Hypertension Mother   . Hyperlipidemia Father   . Hypertension Father   . Hypertension Sister   . Depression Brother    Social History   Tobacco Use  . Smoking status: Former Smoker    Years: 5.00    Types: Cigarettes    Start date: 05/16/1995    Last attempt to quit: 05/16/1999    Years since quitting: 18.3  . Smokeless tobacco: Never Used  Substance Use Topics  . Alcohol use: Yes    Alcohol/week: 1.2 - 1.8 oz    Types: 2 - 3 Cans of beer per week    Comment:  maybe 2-3 cans of beer a day  . Drug use: No    Interim medical history since last visit reviewed. Allergies  and medications reviewed  Review of Systems Per HPI unless specifically indicated above     Objective:    BP 118/74   Pulse 66   Temp 98.6 F (37 C) (Oral)   Resp 14   Ht  (1.778 m)   Wt 237 lb 11.2 oz (107.8 kg)   SpO2 99%   BMI 34.11 kg/m   Wt Readings from Last 3 Encounters:  09/20/17 237 lb 11.2 oz (107.8 kg)  02/21/17 257 lb 9.6 oz (116.8 kg)  07/31/16 247 lb 1 oz (112.1 kg)    Physical Exam  Constitutional: He appears well-developed and well-nourished. No distress.  HENT:  Head: Normocephalic and atraumatic.  Eyes: EOM are normal. Right eye exhibits no discharge. Left eye exhibits no discharge. Right conjunctiva is not injected. Left conjunctiva is not injected. No scleral icterus.  Neck: No thyromegaly present.  Cardiovascular: Normal rate and regular rhythm.  Pulmonary/Chest: Effort normal and breath sounds normal.  Abdominal: Soft. Bowel sounds are normal. He exhibits no distension.  Musculoskeletal: He exhibits no edema.  Neurological: Coordination normal.  Skin: Skin is warm and dry. No  pallor.  Psychiatric: He has a normal mood and affect. His behavior is normal. Judgment and thought content normal.    Results for orders placed or performed in visit on 02/21/17  COMPLETE METABOLIC PANEL WITH GFR  Result Value Ref Range   Glucose, Bld 87 65 - 99 mg/dL   BUN 16 7 - 25 mg/dL   Creat 1.61 0.96 - 0.45 mg/dL   GFR, Est Non African American 105 > OR = 60 mL/min/1.71m2   GFR, Est African American 122 > OR = 60 mL/min/1.50m2   BUN/Creatinine Ratio NOT APPLICABLE 6 - 22 (calc)   Sodium 140 135 - 146 mmol/L   Potassium 4.5 3.5 - 5.3 mmol/L   Chloride 103 98 - 110 mmol/L   CO2 30 20 - 32 mmol/L   Calcium 9.5 8.6 - 10.3 mg/dL   Total Protein 7.5 6.1 - 8.1 g/dL   Albumin 4.4 3.6 - 5.1 g/dL   Globulin 3.1 1.9 - 3.7 g/dL (calc)   AG Ratio 1.4 1.0 - 2.5 (calc)   Total Bilirubin 0.4 0.2 - 1.2 mg/dL   Alkaline phosphatase (APISO) 75 40 - 115 U/L   AST 18 10 -  40 U/L   ALT 20 9 - 46 U/L  Lipid panel  Result Value Ref Range   Cholesterol 250 (H) <200 mg/dL   HDL 86 >40 mg/dL   Triglycerides 68 <981 mg/dL   LDL Cholesterol (Calc) 148 (H) mg/dL (calc)   Total CHOL/HDL Ratio 2.9 <5.0 (calc)   Non-HDL Cholesterol (Calc) 164 (H) <130 mg/dL (calc)  TSH  Result Value Ref Range   TSH 2.25 0.40 - 4.50 mIU/L  VITAMIN D 25 Hydroxy (Vit-D Deficiency, Fractures)  Result Value Ref Range   Vit D, 25-Hydroxy 13 (L) 30 - 100 ng/mL  Carbamazepine Level (Tegretol), total  Result Value Ref Range   Carbamazepine Lvl 11.5 4.0 - 12.0 mg/L  Valproic Acid level  Result Value Ref Range   Valproic Acid Lvl 74.1 50.0 - 100.0 mg/L      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)    Patient does not want to stop either AED; continue both; levels checked, stable in the fall; will check at next f/u; refills provided      Relevant Medications   CARBATROL 200 MG 12 hr capsule   DEPAKOTE ER 500 MG 24 hr tablet     Other   Hyperlipidemia    He's working on weight loss and healthier eating; showed me apps he is using; will plan to check cholesterol at next visit      Allergic conjunctivitis    New drops if needed uring pollen season       Other Visit Diagnoses    Epilepsy, 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  An after-visit summary was printed and given to the patient at check-out.  Please see the patient instructions which may contain other information and recommendations beyond what is mentioned above in the assessment and plan.  Meds ordered this encounter  Medications  . Olopatadine HCl 0.2 % SOLN    Sig: Apply 1 drop  to eye daily. If needed for itchy watery  eyes    Dispense:  2.5 mL    Refill:  11  . CARBATROL 200 MG 12 hr capsule    Sig: Take 2 capsules by mouth in the morning and 3 capsules in the evening    Dispense:  150 capsule    Refill:  5    Brand name same brand as before only, medically necessary; not needed quite yet  . DEPAKOTE ER 500 MG 24 hr tablet    Sig: Take 2 tablets (1,000 mg total) by mouth at bedtime.    Dispense:  180 tablet    Refill:  5    Brand name medically necessary, dispense as written; may leave on file, needed soon    No orders of the defined types were placed in this encounter.

## 2017-09-20 NOTE — Assessment & Plan Note (Signed)
He's working on weight loss and healthier eating; showed me apps he is using; will plan to check cholesterol at next visit

## 2017-09-20 NOTE — Assessment & Plan Note (Signed)
New drops if needed uring pollen season

## 2017-09-20 NOTE — Assessment & Plan Note (Signed)
Patient does not want to stop either AED; continue both; levels checked, stable in the fall; will check at next f/u; refills provided

## 2018-04-24 ENCOUNTER — Telehealth: Payer: Self-pay | Admitting: Family Medicine

## 2018-04-24 ENCOUNTER — Other Ambulatory Visit: Payer: Self-pay

## 2018-04-24 DIAGNOSIS — E559 Vitamin D deficiency, unspecified: Secondary | ICD-10-CM

## 2018-04-24 DIAGNOSIS — E782 Mixed hyperlipidemia: Secondary | ICD-10-CM

## 2018-04-24 DIAGNOSIS — G40802 Other epilepsy, not intractable, without status epilepticus: Secondary | ICD-10-CM

## 2018-04-24 DIAGNOSIS — Z5181 Encounter for therapeutic drug level monitoring: Secondary | ICD-10-CM

## 2018-04-24 DIAGNOSIS — G40409 Other generalized epilepsy and epileptic syndromes, not intractable, without status epilepticus: Secondary | ICD-10-CM

## 2018-04-24 DIAGNOSIS — E669 Obesity, unspecified: Secondary | ICD-10-CM

## 2018-04-24 DIAGNOSIS — G40309 Generalized idiopathic epilepsy and epileptic syndromes, not intractable, without status epilepticus: Secondary | ICD-10-CM

## 2018-04-24 NOTE — Telephone Encounter (Signed)
Pt returning your call.  Pt aware he needs lab done, and he will be in next Tuesday 04/30/18. Morning. please put orders in, thank you.  Pt aware he does not need to see Dr Sherie DonLada.  Will need a 6 month supply when the meds are re ordered. (Jan 2020)

## 2018-04-24 NOTE — Telephone Encounter (Signed)
Unable to leave voicemail, CRM created

## 2018-04-24 NOTE — Telephone Encounter (Signed)
Please let the patient know that we'd like to ask him to get labs Last blood work was October of 2018 He does not need an appt with me Please re-order everything drawn in October 2018, same codes Thank you

## 2018-05-01 LAB — COMPLETE METABOLIC PANEL WITH GFR
AG Ratio: 1.8 (calc) (ref 1.0–2.5)
ALKALINE PHOSPHATASE (APISO): 66 U/L (ref 40–115)
ALT: 18 U/L (ref 9–46)
AST: 16 U/L (ref 10–40)
Albumin: 4.6 g/dL (ref 3.6–5.1)
BUN: 20 mg/dL (ref 7–25)
CO2: 30 mmol/L (ref 20–32)
CREATININE: 0.95 mg/dL (ref 0.60–1.35)
Calcium: 9.5 mg/dL (ref 8.6–10.3)
Chloride: 105 mmol/L (ref 98–110)
GFR, Est African American: 116 mL/min/{1.73_m2} (ref >=60)
GFR, Est Non African American: 100 mL/min/{1.73_m2} (ref >=60)
Globulin: 2.5 g/dL (calc) (ref 1.9–3.7)
Glucose, Bld: 78 mg/dL (ref 65–139)
Potassium: 4.3 mmol/L (ref 3.5–5.3)
Sodium: 141 mmol/L (ref 135–146)
Total Bilirubin: 0.2 mg/dL (ref 0.2–1.2)
Total Protein: 7.1 g/dL (ref 6.1–8.1)

## 2018-05-01 LAB — LIPID PANEL
Cholesterol: 226 mg/dL — ABNORMAL HIGH (ref <200)
HDL: 75 mg/dL (ref >40)
LDL Cholesterol (Calc): 137 mg/dL (calc) — ABNORMAL HIGH
Non-HDL Cholesterol (Calc): 151 mg/dL (calc) — ABNORMAL HIGH (ref <130)
Total CHOL/HDL Ratio: 3 (calc) (ref <5.0)
Triglycerides: 53 mg/dL (ref <150)

## 2018-05-01 LAB — VITAMIN D 25 HYDROXY (VIT D DEFICIENCY, FRACTURES): Vit D, 25-Hydroxy: 24 ng/mL — ABNORMAL LOW (ref 30–100)

## 2018-05-01 LAB — VALPROIC ACID LEVEL: Valproic Acid Lvl: 48.7 mg/L — ABNORMAL LOW (ref 50.0–100.0)

## 2018-05-01 LAB — CARBAMAZEPINE LEVEL, TOTAL: Carbamazepine Lvl: 8.8 mg/L (ref 4.0–12.0)

## 2018-05-01 LAB — TSH: TSH: 5.06 mIU/L — ABNORMAL HIGH (ref 0.40–4.50)

## 2018-05-01 NOTE — Progress Notes (Signed)
Pedro BradfordKimberly, please let the patient know that his VPA level is just a little low; we want it between 50 and 100; has he missed a dose recently? His CBZ level is normal Vitamin D is a little low; pick up some 800 or 1000 iu pills; take 1600 to 2,000 iu of vitamin D3 daily for the next 3-4 months, then just 800 to 1000 iu daily TSH is just a little off; gaining weight? Constipation? Feeling tired? If so, we should recheck this in 3 months; if asymptomatic, no worries His LDL cholesterol is a little better, but still higher than ideal; encourage weight loss, healthy eating; other labs fine

## 2018-05-02 ENCOUNTER — Telehealth: Payer: Self-pay | Admitting: Family Medicine

## 2018-05-02 NOTE — Telephone Encounter (Signed)
Pt given lab results per notes of Dr Sherie DonLada on 05/01/19. Pt verbalized understanding.

## 2018-06-04 ENCOUNTER — Other Ambulatory Visit: Payer: Self-pay | Admitting: Family Medicine

## 2018-06-04 DIAGNOSIS — G40309 Generalized idiopathic epilepsy and epileptic syndromes, not intractable, without status epilepticus: Secondary | ICD-10-CM

## 2018-06-04 DIAGNOSIS — G40409 Other generalized epilepsy and epileptic syndromes, not intractable, without status epilepticus: Secondary | ICD-10-CM

## 2018-06-04 MED ORDER — CARBATROL 200 MG PO CP12: ORAL_CAPSULE | ORAL | 5 refills | Status: DC

## 2018-06-04 NOTE — Telephone Encounter (Signed)
Copied from CRM [id number]. Topic: Quick Communication - Rx Refill/Question >> Jun 04, 2018  3:37 PM Maia PettiesOrtiz, Kristie S wrote: Medication: CARBATROL 200 MG 12 hr capsule - pt states only 1 refill was given after he came in for lab work in December. Please advise.  Has the patient contacted their pharmacy? yes Preferred Pharmacy (with phone number or street name): [id number], [place] - [address] [phone] (Phone) [phone] (Fax)

## 2018-08-26 ENCOUNTER — Encounter: Payer: Self-pay | Admitting: Nurse Practitioner

## 2018-08-26 ENCOUNTER — Other Ambulatory Visit: Payer: Self-pay

## 2018-08-26 ENCOUNTER — Ambulatory Visit (INDEPENDENT_AMBULATORY_CARE_PROVIDER_SITE_OTHER): Payer: Managed Care, Other (non HMO) | Admitting: Nurse Practitioner

## 2018-08-26 VITALS — BP 118/76 | HR 92 | Temp 98.1°F | Resp 14 | Ht 70.0 in | Wt 225.6 lb

## 2018-08-26 DIAGNOSIS — Z5181 Encounter for therapeutic drug level monitoring: Secondary | ICD-10-CM

## 2018-08-26 DIAGNOSIS — R7989 Other specified abnormal findings of blood chemistry: Secondary | ICD-10-CM

## 2018-08-26 DIAGNOSIS — R059 Cough, unspecified: Secondary | ICD-10-CM

## 2018-08-26 DIAGNOSIS — R05 Cough: Secondary | ICD-10-CM

## 2018-08-26 DIAGNOSIS — K2100 Gastro-esophageal reflux disease with esophagitis, without bleeding: Secondary | ICD-10-CM

## 2018-08-26 DIAGNOSIS — R079 Chest pain, unspecified: Secondary | ICD-10-CM | POA: Diagnosis not present

## 2018-08-26 MED ORDER — PANTOPRAZOLE SODIUM 40 MG PO TBEC: 40.0000 mg | DELAYED_RELEASE_TABLET | Freq: Every day | ORAL | 0 refills | Status: DC

## 2018-08-26 MED ORDER — BENZONATATE 100 MG PO CAPS: 100.0000 mg | ORAL_CAPSULE | Freq: Two times a day (BID) | ORAL | 0 refills | Status: DC | PRN

## 2018-08-26 NOTE — Patient Instructions (Addendum)
Bad cholesterol, also called low-density lipoprotein (LDL), carries cholesterol and other fats that your liver makes to your body tissue. If it builds up in blood vessels, LDL can cause heart disease and other health problems. Your LDL level should be below 115. If you have diabetes or a possible heart problem, your LDL should be below 70.  Eat: Eat 20 to 30 grams of soluble fiber every day. Foods such as fruits and vegetables, whole grains, beans, peas, nuts, and seeds can help lower LDL. Avoid: Saturated fats (Dairy foods - such as butter, cream, ghee, regular-fat milk and cheese. Meat - such as fatty cuts of beef, pork and lamb, processed meats like salami, sausages and the skin on chicken. Lard., fatty snack foods, cakes, biscuits, pies and deep fried foods) Avoid smoking   Food Choices for Gastroesophageal Reflux Disease, Adult When you have gastroesophageal reflux disease (GERD), the foods you eat and your eating habits are very important. Choosing the right foods can help ease your discomfort. Think about working with a nutrition specialist (dietitian) to help you make good choices. Meals  Choose healthy foods that are low in fat, such as fruits, vegetables, whole grains, low-fat dairy products, and lean meat, fish, and poultry.  Eat small meals often instead of 3 large meals a day. Eat your meals slowly, and in a place where you are relaxed. Avoid bending over or lying down until 2-3 hours after eating.  Avoid eating meals 2-3 hours before bed.  Avoid drinking a lot of liquid with meals.  Cook foods using methods other than frying. Bake, grill, or broil food instead.  Avoid or limit: ? Chocolate. ? Peppermint or spearmint. ? Alcohol. ? Pepper. ? Black and decaffeinated coffee. ? Black and decaffeinated tea. ? Bubbly (carbonated) soft drinks. ? Caffeinated energy drinks and soft drinks.  Limit high-fat foods such as: ? Fatty meat or fried foods. ? Whole milk, cream, butter, or  ice cream. ? Nuts and nut butters. ? Pastries, donuts, and sweets made with butter or shortening.  Avoid foods that cause symptoms. These foods may be different for everyone. Common foods that cause symptoms include: ? Tomatoes. ? Oranges, lemons, and limes. ? Peppers. ? Spicy food. ? Onions and garlic. ? Vinegar. Lifestyle  Maintain a healthy weight. Ask your doctor what weight is healthy for you. If you need to lose weight, work with your doctor to do so safely.  Exercise for at least 30 minutes for 5 or more days each week, or as told by your doctor.  Wear loose-fitting clothes.  Do not smoke. If you need help quitting, ask your doctor.  Sleep with the head of your bed higher than your feet. Use a wedge under the mattress or blocks under the bed frame to raise the head of the bed. Summary  When you have gastroesophageal reflux disease (GERD), food and lifestyle choices are very important in easing your symptoms.  Eat small meals often instead of 3 large meals a day. Eat your meals slowly, and in a place where you are relaxed.  Limit high-fat foods such as fatty meat or fried foods.  Avoid bending over or lying down until 2-3 hours after eating.  Avoid peppermint and spearmint, caffeine, alcohol, and chocolate. This information is not intended to replace advice given to you by your health care provider. Make sure you discuss any questions you have with your health care provider. Document Released: 10/31/2011 Document Revised: 06/06/2016 Document Reviewed: 06/06/2016 Elsevier Interactive Patient Education  2019 [place].

## 2018-08-26 NOTE — Progress Notes (Signed)
Name: Pedro [name]   MRN: [id number]    DOB: [birth year]   Date:08/26/2018       Progress Note  Subjective  Chief Complaint  Chief Complaint  Patient presents with  . Follow-up    went to urgent care     HPI  Patient presents for chest pain started on 3/26, states was having intermittent sharp chest pain all over chest. Episodes would last just a few seconds. Was also having intermittent mild shortness of breath at that time. States had dry cough. Was seen at urgent care and tested for influenza and COVID19- both tests were normal. States self-quarantined for 14 days. Shortness of breath resolved but is still having chest pain. States this chest pain is burning sensation mid chest goes up to esophagus. States pain is worst after eating pizza and orange juice and can take a few hours to resolve.  Still having dry cough, no fevers or chills. Denies sinus pain or congestion, vomiting, diarrhea. States has some nausea after eating spicy or acidic foods. States intermittent sharp chest pains have resolved.    Lab Results  Component Value Date   CHOL 226 (H) 04/30/2018   HDL 75 04/30/2018   LDLCALC 137 (H) 04/30/2018   TRIG 53 04/30/2018   CHOLHDL 3.0 04/30/2018     PHQ2/9: Depression screen PHQ 2/9 08/26/2018 09/20/2017 02/21/2017 07/31/2016 06/08/2016  Decreased Interest 0 0 0 0 0  Down, Depressed, Hopeless 0 0 0 0 0  PHQ - 2 Score 0 0 0 0 0  Altered sleeping 0 - - - -  Tired, decreased energy 0 - - - -  Change in appetite 0 - - - -  Feeling bad or failure about yourself  0 - - - -  Trouble concentrating 0 - - - -  Moving slowly or fidgety/restless 0 - - - -  Suicidal thoughts 0 - - - -  PHQ-9 Score 0 - - - -    PHQ reviewed. Negative  Patient Active Problem List   Diagnosis Date Noted  . Allergic conjunctivitis 09/20/2017  . Obesity (BMI 35.0-39.9 without comorbidity) 02/21/2017  . Hyperlipidemia 07/31/2016  . Admission for therapeutic drug monitoring 07/31/2016  .  Seborrheic keratosis 06/08/2016  . Screening examination for STD (sexually transmitted disease) 06/08/2016  . Vitamin D deficiency 02/01/2016  . Encounter for medication monitoring 02/01/2016  . 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 Arthritis   . Seizures (HCC)     Past Surgical History:  Procedure Laterality Date  . WISDOM TOOTH EXTRACTION  04/21/2016    Social History   Tobacco Use  . Smoking status: Former Smoker    Years: 5.00    Types: Cigarettes    Start date: 05/16/1995    Last attempt to quit: 05/16/1999    Years since quitting: 19.2  . Smokeless tobacco: Never Used  Substance Use Topics  . Alcohol use: Yes    Alcohol/week: 2.0 - 3.0 standard drinks    Types: 2 - 3 Cans of beer per week    Comment:  maybe 2-3 cans of beer a day     Current Outpatient Medications:  .  calcium carbonate (TUMS - DOSED IN MG ELEMENTAL CALCIUM) 500 MG chewable tablet, Chew 1 tablet by mouth daily., Disp: , Rfl:  .  CARBATROL 200 MG 12 hr  capsule, Take 2 capsules in the morning and 3 capsules in the evening, Disp: 150 capsule, Rfl: 5 .  DEPAKOTE ER 500 MG 24 hr tablet, Take 2 tablets (1,000 mg total) by mouth at bedtime., Disp: 180 tablet, Rfl: 5 .  Olopatadine HCl 0.2 % SOLN, Apply 1 drop to eye daily. If needed for itchy watery eyes (Patient not taking: Reported on 08/26/2018), Disp: 2.5 mL, Rfl: 11  Allergies  Allergen Reactions  . Caffeine   . Nicotine     Review of Systems  Constitutional: Negative for chills, fever and malaise/fatigue.  HENT: Negative for congestion, sinus pain and sore throat.   Eyes: Negative for blurred vision.  Respiratory: Positive for cough (dry cough- resolving). Negative for shortness of breath and wheezing.   Cardiovascular: Positive for chest pain. Negative for palpitations and leg swelling.   Gastrointestinal: Positive for heartburn and nausea. Negative for abdominal pain, constipation, diarrhea and vomiting.  Musculoskeletal: Negative for joint pain and myalgias.  Skin: Negative for rash.  Neurological: Negative for dizziness and headaches.  Psychiatric/Behavioral: The patient is not nervous/anxious and does not have insomnia.       No other specific complaints in a complete review of systems (except as listed in HPI above).  Objective  Vitals:   08/26/18 1104  BP: 118/76  Pulse: 92  Resp: 14  Temp: 98.1 F (36.7 C)  TempSrc: Oral  SpO2: 93%  Weight: 225 lb 9.6 oz (102.3 kg)  Height:  (1.778 m)     Body mass index is 32.37 kg/m.  Nursing Note and Vital Signs reviewed.  Physical Exam Vitals signs reviewed.  Constitutional:      Appearance: He is well-developed.  HENT:     Head: Normocephalic and atraumatic.  Neck:     Musculoskeletal: Normal range of motion and neck supple.     Vascular: No carotid bruit.  Cardiovascular:     Pulses: Normal pulses.     Heart sounds: Normal heart sounds.  Pulmonary:     Effort: Pulmonary effort is normal.     Breath sounds: Normal breath sounds.  Chest:     Chest wall: No mass.     Breasts:        Right: Normal.        Left: Normal.  Abdominal:     General: Bowel sounds are normal.     Palpations: Abdomen is soft.     Tenderness: There is no abdominal tenderness. There is no right CVA tenderness or left CVA tenderness.    Musculoskeletal: Normal range of motion.     Right lower leg: No edema.     Left lower leg: No edema.  Skin:    General: Skin is warm and dry.     Capillary Refill: Capillary refill takes less than 2 seconds.  Neurological:     Mental Status: He is alert and oriented to person, place, and time.     GCS: GCS eye subscore is 4. GCS verbal subscore is 5. GCS motor subscore is 6.     Sensory: No sensory deficit.  Psychiatric:        Speech: Speech normal.        Behavior: Behavior  normal.        Thought Content: Thought content normal.        Judgment: Judgment normal.        No results found for this or any previous visit (from the past 48 hour(s)).  Assessment & Plan  1. Chest pain, unspecified type Possible bronchitis initially, sharp pains and cough resolving Additionally sounds like GERD, treat with PPI, check for Hpylori, follow-up in 3 weeks Red flag signs discussed.  - EKG 12-Lead: normal sinus rhythm without ectopy - CBC with Differential - COMPLETE METABOLIC PANEL WITH GFR - H. pylori breath test - pantoprazole (PROTONIX) 40 MG tablet; Take 1 tablet (40 mg total) by mouth daily.  Dispense: 90 tablet; Refill: 0  2. Medication monitoring encounter Adjust meds as necessary  - Valproic Acid level  3. Abnormal TSH Endorses mild fatigue but states just during this illness, will recheck  - TSH  4. Gastroesophageal reflux disease with esophagitis Discussed, diet, portion control, bed wedge, weight loss.  - pantoprazole (PROTONIX) 40 MG tablet; Take 1 tablet (40 mg total) by mouth daily.  Dispense: 90 tablet; Refill: 0  5. Cough  - benzonatate (TESSALON) 100 MG capsule; Take 1 capsule (100 mg total) by mouth 2 (two) times daily as needed for cough.  Dispense: 20 capsule; Refill: 0    -Red flags and when to present for emergency care or RTC including fever >101.73F, chest pain, shortness of breath, new/worsening/un-resolving symptoms,  reviewed with patient at time of visit. Follow up and care instructions discussed and provided in AVS.

## 2018-08-27 LAB — CBC WITH DIFFERENTIAL/PLATELET
Absolute Monocytes: 559 cells/uL (ref 200–950)
Basophils Absolute: 23 cells/uL (ref 0–200)
Basophils Relative: 0.4 %
Eosinophils Absolute: 63 cells/uL (ref 15–500)
Eosinophils Relative: 1.1 %
HCT: 47.2 % (ref 38.5–50.0)
Hemoglobin: 15.9 g/dL (ref 13.2–17.1)
Lymphs Abs: 2314 cells/uL (ref 850–3900)
MCH: 29.1 pg (ref 27.0–33.0)
MCHC: 33.7 g/dL (ref 32.0–36.0)
MCV: 86.4 fL (ref 80.0–100.0)
MPV: 10.4 fL (ref 7.5–12.5)
Monocytes Relative: 9.8 %
Neutro Abs: 2742 cells/uL (ref 1500–7800)
Neutrophils Relative %: 48.1 %
Platelets: 215 10*3/uL (ref 140–400)
RBC: 5.46 10*6/uL (ref 4.20–5.80)
RDW: 13 % (ref 11.0–15.0)
Total Lymphocyte: 40.6 %
WBC: 5.7 10*3/uL (ref 3.8–10.8)

## 2018-08-27 LAB — COMPLETE METABOLIC PANEL WITH GFR
AG Ratio: 1.7 (calc) (ref 1.0–2.5)
ALT: 22 U/L (ref 9–46)
AST: 19 U/L (ref 10–40)
Albumin: 4.8 g/dL (ref 3.6–5.1)
Alkaline phosphatase (APISO): 67 U/L (ref 36–130)
BUN: 19 mg/dL (ref 7–25)
CO2: 26 mmol/L (ref 20–32)
Calcium: 9.8 mg/dL (ref 8.6–10.3)
Chloride: 103 mmol/L (ref 98–110)
Creat: 0.94 mg/dL (ref 0.60–1.35)
GFR, Est African American: 117 mL/min/{1.73_m2} (ref >=60)
GFR, Est Non African American: 101 mL/min/{1.73_m2} (ref >=60)
Globulin: 2.8 g/dL (calc) (ref 1.9–3.7)
Glucose, Bld: 85 mg/dL (ref 65–99)
Potassium: 4.1 mmol/L (ref 3.5–5.3)
Sodium: 138 mmol/L (ref 135–146)
Total Bilirubin: 0.3 mg/dL (ref 0.2–1.2)
Total Protein: 7.6 g/dL (ref 6.1–8.1)

## 2018-08-27 LAB — H. PYLORI BREATH TEST: H. pylori Breath Test: NOT DETECTED

## 2018-08-27 LAB — TSH: TSH: 4.92 mIU/L — ABNORMAL HIGH (ref 0.40–4.50)

## 2018-08-27 LAB — VALPROIC ACID LEVEL: Valproic Acid Lvl: 56.9 mg/L (ref 50.0–100.0)

## 2018-09-16 ENCOUNTER — Encounter: Payer: Self-pay | Admitting: Nurse Practitioner

## 2018-09-16 ENCOUNTER — Ambulatory Visit (INDEPENDENT_AMBULATORY_CARE_PROVIDER_SITE_OTHER): Payer: Managed Care, Other (non HMO) | Admitting: Nurse Practitioner

## 2018-09-16 ENCOUNTER — Other Ambulatory Visit: Payer: Self-pay

## 2018-09-16 VITALS — Ht 70.0 in | Wt 220.0 lb

## 2018-09-16 DIAGNOSIS — K2100 Gastro-esophageal reflux disease with esophagitis, without bleeding: Secondary | ICD-10-CM

## 2018-09-16 DIAGNOSIS — R079 Chest pain, unspecified: Secondary | ICD-10-CM

## 2018-09-16 DIAGNOSIS — G40309 Generalized idiopathic epilepsy and epileptic syndromes, not intractable, without status epilepticus: Secondary | ICD-10-CM

## 2018-09-16 DIAGNOSIS — R1013 Epigastric pain: Secondary | ICD-10-CM

## 2018-09-16 DIAGNOSIS — G40409 Other generalized epilepsy and epileptic syndromes, not intractable, without status epilepticus: Secondary | ICD-10-CM

## 2018-09-16 MED ORDER — DEPAKOTE ER 500 MG PO TB24: 1000.0000 mg | ORAL_TABLET | Freq: Every day | ORAL | 5 refills | Status: DC

## 2018-09-16 MED ORDER — PANTOPRAZOLE SODIUM 40 MG PO TBEC: 40.0000 mg | DELAYED_RELEASE_TABLET | Freq: Every day | ORAL | 1 refills | Status: DC

## 2018-09-16 NOTE — Patient Instructions (Signed)
Low acid and low fat diet for 2 weeks see how this improves your symptoms and slowly add back foods as tolerated Take protonix 40 mg daily; if unimproved or worsening at any time let us know and we can refer you to GI.  Increase fiber in your diet 20-30g of soluble fiber daily and 64 ounces of water.

## 2018-09-16 NOTE — Progress Notes (Signed)
Virtual Visit via Video Note  I connected with Monico BlitzJustin Dimon on 09/16/18 at 10:00 AM EDT by a video enabled telemedicine application and verified that I am speaking with the correct person using two identifiers.   Staff discussed the limitations of evaluation and management by telemedicine and the availability of in person appointments. The patient expressed understanding and agreed to proceed.  Patient location: home  My location: work office Other people present:  none HPI  Patient states chest pain are gone feels better overall.  States when he eats he has been having epigastric pain after meals on the days he is not taking protonix.  States has been avoiding hot sauces and tomato sauces. States also notes certain acidic drinks trigger it for him. States sometimes when stomach hurts gets nausea but no vomiting. States had an episode of soft stools Saturday but thinks its related to increased fiber.  Seizures well controlled on current regimen, requesting refill on depakote.  [facility] 09/16/2018 08/26/2018 09/20/2017 02/21/2017 07/31/2016  Decreased Interest 0 0 0 0 0  Down, Depressed, Hopeless 0 0 0 0 0  PHQ - 2 Score 0 0 0 0 0  Altered sleeping 0 0 - - -  Tired, decreased energy 0 0 - - -  Change in appetite 0 0 - - -  Feeling bad or failure about yourself  0 0 - - -  Trouble concentrating 0 0 - - -  Moving slowly or fidgety/restless 0 0 - - -  Suicidal thoughts 0 0 - - -  PHQ-9 Score 0 0 - - -     PHQ reviewed. Negative  Patient Active Problem List   Diagnosis Date Noted  . Allergic conjunctivitis 09/20/2017  . Obesity (BMI 35.0-39.9 without comorbidity) 02/21/2017  . Hyperlipidemia 07/31/2016  . Admission for therapeutic drug monitoring 07/31/2016  . Seborrheic keratosis 06/08/2016  . Screening examination for STD (sexually transmitted disease) 06/08/2016  . Vitamin D deficiency 02/01/2016  . Encounter for medication monitoring 02/01/2016  .  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 Arthritis   . Seizures (HCC)     Past Surgical History:  Procedure Laterality Date  . WISDOM TOOTH EXTRACTION  04/21/2016    Social History   Tobacco Use  . Smoking status: Former Smoker    Years: 5.00    Types: Cigarettes    Start date: 05/16/1995    Last attempt to quit: 05/16/1999    Years since quitting: 19.3  . Smokeless tobacco: Never Used  Substance Use Topics  . Alcohol use: Yes    Alcohol/week: 2.0 - 3.0 standard drinks    Types: 2 - 3 Cans of beer per week    Comment:  maybe 2-3 cans of beer a day     Current Outpatient Medications:  .  benzonatate (TESSALON) 100 MG capsule, Take 1 capsule (100 mg total) by mouth 2 (two) times daily as needed for cough., Disp: 20 capsule, Rfl: 0 .  CARBATROL 200 MG 12 hr capsule, Take 2 capsules in the morning and 3 capsules in the evening, Disp: 150 capsule, Rfl: 5 .  DEPAKOTE ER 500 MG 24 hr tablet, Take 2 tablets (1,000 mg total) by mouth at bedtime., Disp: 180 tablet, Rfl: 5 .  pantoprazole (PROTONIX) 40 MG tablet, Take 1 tablet (40 mg total) by  mouth daily., Disp: 90 tablet, Rfl: 0 .  Pediatric Multiple Vitamins (FLINTSTONES MULTIVITAMIN PO), Take by mouth., Disp: , Rfl:  .  calcium carbonate (TUMS - DOSED IN MG ELEMENTAL CALCIUM) 500 MG chewable tablet, Chew 1 tablet by mouth daily., Disp: , Rfl:  .  Olopatadine HCl 0.2 % SOLN, Apply 1 drop to eye daily. If needed for itchy watery eyes (Patient not taking: Reported on 08/26/2018), Disp: 2.5 mL, Rfl: 11  Allergies  Allergen Reactions  . Caffeine   . Nicotine     ROS   No other specific complaints in a complete review of systems (except as listed in HPI above).  Objective  Vitals:   09/16/18 1002  Weight: 220 lb (99.8 kg)  Height: 5\' 10"  (1.778 m)     Body mass index  is 31.57 kg/m.  Nursing Note and Vital Signs reviewed.  Physical Exam  Constitutional: Patient appears well-developed and well-nourished. No distress.  HENT: Head: Normocephalic and atraumatic. Pulmonary/Chest: Effort normal  Musculoskeletal: Normal range of motion,  Abdomen: patient has mild epigastric pain with deep self-palpation.  Neurological: he is alert and oriented to person, place, and time. speech and gait are normal.  Skin: No rash noted. No erythema.  Psychiatric: Patient has a normal mood and affect. behavior is normal. Judgment and thought content normal.    Assessment & Plan 1. Chest pain, unspecified type resolved - pantoprazole (PROTONIX) 40 MG tablet; Take 1 tablet (40 mg total) by mouth daily.  Dispense: 90 tablet; Refill: 1  2. Gastroesophageal reflux disease with esophagitis - pantoprazole (PROTONIX) 40 MG tablet; Take 1 tablet (40 mg total) by mouth daily.  Dispense: 90 tablet; Refill: 1  3. Epigastric pain GERD vs gastritis Low acid and low fat diet for 2 weeks see how this improves your symptoms and slowly add back foods as tolerated Take protonix 40 mg daily; if unimproved or worsening at any time let us know and we can refer you to GI.  Increase fiber in your diet 20-30g of soluble fiber daily and 64 ounces of water. Bed wedge, small frequent meals, dicussed avoiding trigger foods.    4. 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  Dispense: 180 tablet; Refill: 5   Follow Up Instructions:   follow- up in 6 months or sooner if unimproved or worsening symptoms  I discussed the assessment and treatment plan with the patient. The patient was provided an opportunity to ask questions and all were answered. The patient agreed with the plan and demonstrated an understanding of the instructions.   The patient was advised to call back or seek an in-person evaluation if the symptoms worsen  or if the condition fails to improve as anticipated.  I provided 22 minutes of non-face-to-face time during this encounter.   Cheryle Horsfall, NP

## 2018-12-04 ENCOUNTER — Other Ambulatory Visit: Payer: Self-pay | Admitting: Family Medicine

## 2018-12-04 DIAGNOSIS — G40409 Other generalized epilepsy and epileptic syndromes, not intractable, without status epilepticus: Secondary | ICD-10-CM

## 2018-12-04 DIAGNOSIS — G40309 Generalized idiopathic epilepsy and epileptic syndromes, not intractable, without status epilepticus: Secondary | ICD-10-CM

## 2018-12-04 NOTE — Telephone Encounter (Signed)
Please schedule patient for routine appointment in December

## 2019-05-01 ENCOUNTER — Other Ambulatory Visit: Payer: Self-pay

## 2019-05-01 DIAGNOSIS — G40409 Other generalized epilepsy and epileptic syndromes, not intractable, without status epilepticus: Secondary | ICD-10-CM

## 2019-05-01 DIAGNOSIS — G40309 Generalized idiopathic epilepsy and epileptic syndromes, not intractable, without status epilepticus: Secondary | ICD-10-CM

## 2019-05-01 MED ORDER — CARBATROL 200 MG PO CP12: ORAL_CAPSULE | ORAL | 0 refills | Status: DC

## 2019-05-01 NOTE — Telephone Encounter (Signed)
Please schedule patient for follow up in the next 30 days.  

## 2019-05-27 ENCOUNTER — Encounter: Payer: Self-pay | Admitting: Family Medicine

## 2019-05-27 ENCOUNTER — Other Ambulatory Visit: Payer: Self-pay

## 2019-05-27 ENCOUNTER — Ambulatory Visit: Payer: Managed Care, Other (non HMO) | Admitting: Family Medicine

## 2019-05-27 VITALS — BP 122/82 | HR 73 | Temp 97.9°F | Resp 14 | Ht 70.0 in | Wt 241.9 lb

## 2019-05-27 DIAGNOSIS — G40309 Generalized idiopathic epilepsy and epileptic syndromes, not intractable, without status epilepticus: Secondary | ICD-10-CM

## 2019-05-27 DIAGNOSIS — K8050 Calculus of bile duct without cholangitis or cholecystitis without obstruction: Secondary | ICD-10-CM | POA: Diagnosis not present

## 2019-05-27 DIAGNOSIS — G40409 Other generalized epilepsy and epileptic syndromes, not intractable, without status epilepticus: Secondary | ICD-10-CM

## 2019-05-27 DIAGNOSIS — K2100 Gastro-esophageal reflux disease with esophagitis, without bleeding: Secondary | ICD-10-CM

## 2019-05-27 DIAGNOSIS — Z5181 Encounter for therapeutic drug level monitoring: Secondary | ICD-10-CM

## 2019-05-27 DIAGNOSIS — R1010 Upper abdominal pain, unspecified: Secondary | ICD-10-CM | POA: Diagnosis not present

## 2019-05-27 MED ORDER — METOCLOPRAMIDE HCL 10 MG PO TABS: 10.0000 mg | ORAL_TABLET | Freq: Three times a day (TID) | ORAL | 1 refills | Status: DC | PRN

## 2019-05-27 MED ORDER — METOCLOPRAMIDE HCL 10 MG PO TABS: 10.0000 mg | ORAL_TABLET | Freq: Three times a day (TID) | ORAL | 0 refills | Status: DC | PRN

## 2019-05-27 MED ORDER — PANTOPRAZOLE SODIUM 40 MG PO TBEC: 40.0000 mg | DELAYED_RELEASE_TABLET | Freq: Every day | ORAL | 1 refills | Status: DC

## 2019-05-27 MED ORDER — CARBATROL 200 MG PO CP12: ORAL_CAPSULE | ORAL | 1 refills | Status: DC

## 2019-05-27 MED ORDER — DEPAKOTE ER 500 MG PO TB24: 1000.0000 mg | ORAL_TABLET | Freq: Every day | ORAL | 1 refills | Status: DC

## 2019-05-27 MED ORDER — CARBATROL 200 MG PO CP12: ORAL_CAPSULE | ORAL | 0 refills | Status: DC

## 2019-05-27 NOTE — Progress Notes (Signed)
Name: Pedro [name]   MRN: [id number]    DOB: [birth year]   Date:05/27/2019       Progress Note  Chief Complaint  Patient presents with  . Medication Refill  . Abdominal Pain    can not eat ground beef, greasy food     Subjective:   Pedro [name] is a 39 y.o. male, presents to clinic for routine follow up on the conditions listed above.  Medication Refill Associated symptoms include abdominal pain and nausea. Pertinent negatives include no chest pain, chills, diaphoresis, fatigue, fever or vomiting.  Abdominal Pain This is a new problem. Episode onset: 10 month. Onset quality: worse waves of pain, sometimes intermittent sometimes several days constant pain. The problem occurs intermittently. The pain is located in the epigastric region (located to epigastrum and sometime generalized to upper central abdomen). The pain is severe. Quality: sharp, cramping, pressure. Pain radiation: no real radiation. Associated symptoms include nausea. Pertinent negatives include no constipation, diarrhea, fever or vomiting. Associated symptoms comments: No D/constipation, bloated and gassy, with worse pain could not belch or pass gas, some associated N,  no weight loss. Exacerbated by: foods with any grease or meat make sx worse. Relieved by: avoiding meat and fats/grease. He has tried proton pump inhibitors (protonix 40 mg PRN) for the symptoms. The treatment provided mild relief.   Red meat  Chicken and seafood grilled or fried doesn't cause his sx, no hx of tick bite   Need med refill on seizure meds, he has not had a seizure in over a decade he states his medications have been stable, he does require brand-name of Carbatrol is taking 400 mg in the morning and 600 mg in the evening, also taking Depakote 1000 mg in the evening every day, is compliant with his medications has no side effects or concerns, is due for his refills and med monitoring today, unfortunately the lab tech is not here this  morning he will return next week for his blood work.  Patient Active Problem List   Diagnosis Date Noted  . Allergic conjunctivitis 09/20/2017  . Obesity (BMI 35.0-39.9 without comorbidity) 02/21/2017  . Hyperlipidemia 07/31/2016  . Admission for therapeutic drug monitoring 07/31/2016  . Seborrheic keratosis 06/08/2016  . Screening examination for STD (sexually transmitted disease) 06/08/2016  . Vitamin D deficiency 02/01/2016  . Encounter for medication monitoring 02/01/2016  . 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 Bulge of lumbar disc without myelopathy 01/26/2015    Past Surgical History:  Procedure Laterality Date  . WISDOM TOOTH EXTRACTION  04/21/2016    Family History  Problem Relation Age of Onset  . Seizures Mother   . Hypertension Mother   . Hyperlipidemia Father   . Hypertension Father   . Hypertension Sister   . Depression Brother     Social History   Socioeconomic History  . Marital status: Single    Spouse name: Not on file  . Number of children: 0  . Years of education: Not on file  . Highest education level: Some college, no degree  Occupational History  . Not on file  Tobacco Use  . Smoking status: Former Smoker    Years: 5.00    Types: Cigarettes    Start date: 05/16/1995    Quit date: 05/16/1999    Years since quitting: 20.0  . Smokeless tobacco: Never Used  Substance and Sexual Activity  . Alcohol use: Yes  Alcohol/week: 2.0 - 3.0 standard drinks    Types: 2 - 3 Cans of beer per week    Comment:  maybe 2-3 cans of beer a day  . Drug use: No  . Sexual activity: Not Currently    Partners: Female  Other Topics Concern  . Not on file  Social History Narrative  . Not on file   Social Determinants of Health   Financial Resource Strain: Low Risk   . Difficulty of Paying Living Expenses: Not hard at all  Food Insecurity: No Food Insecurity  . Worried About Programme researcher, broadcasting/film/video  in the Last Year: Never true  . Ran Out of Food in the Last Year: Never true  Transportation Needs: No Transportation Needs  . Lack of Transportation (Medical): No  . Lack of Transportation (Non-Medical): No  Physical Activity: Sufficiently Active  . Days of Exercise per Week: 5 days  . Minutes of Exercise per Session: 120 min  Stress: No Stress Concern Present  . Feeling of Stress : Not at all  Social Connections: Moderately Isolated  . Frequency of Communication with Friends and Family: More than three times a week  . Frequency of Social Gatherings with Friends and Family: Three times a week  . Attends Religious Services: Never  . Active Member of Clubs or Organizations: No  . Attends Banker Meetings: Never  . Marital Status: Never married  Intimate Partner Violence: Not At Risk  . Fear of Current or Ex-Partner: No  . Emotionally Abused: No  . Physically Abused: No  . Sexually Abused: No     Current Outpatient Medications:  .  calcium carbonate (TUMS - DOSED IN MG ELEMENTAL CALCIUM) 500 MG chewable tablet, Chew 1 tablet by mouth as needed. , Disp: , Rfl:  .  CARBATROL 200 MG 12 hr capsule, Take 2 capsules int he morning and 3 capsules in the evening, Disp: 150 capsule, Rfl: 0 .  DEPAKOTE ER 500 MG 24 hr tablet, Take 2 tablets (1,000 mg total) by mouth at bedtime., Disp: 180 tablet, Rfl: 5 .  pantoprazole (PROTONIX) 40 MG tablet, Take 1 tablet (40 mg total) by mouth daily. (Patient taking differently: Take 40 mg by mouth as needed. ), Disp: 90 tablet, Rfl: 1 .  Pediatric Multiple Vitamins (FLINTSTONES MULTIVITAMIN PO), Take by mouth., Disp: , Rfl:   Allergies  Allergen Reactions  . Caffeine   . Nicotine     Chart Review Today: I personally reviewed active problem list, medication list, allergies, family history, social history, health maintenance, notes from last encounter, lab results, imaging with the patient/caregiver today.   Review of Systems    Constitutional: Negative.  Negative for activity change, appetite change, chills, diaphoresis, fatigue, fever and unexpected weight change.  HENT: Negative.   Eyes: Negative.   Respiratory: Negative.   Cardiovascular: Negative.  Negative for chest pain, palpitations and leg swelling.  Gastrointestinal: Positive for abdominal distention, abdominal pain and nausea. Negative for anal bleeding, blood in stool, constipation, diarrhea, rectal pain and vomiting.  Endocrine: Negative.   Genitourinary: Negative.   Musculoskeletal: Negative.   Skin: Negative.  Negative for color change.  Allergic/Immunologic: Negative.   Neurological: Negative.   Hematological: Negative.   Psychiatric/Behavioral: Negative.   All other systems reviewed and are negative.    Objective:    Vitals:   05/27/19 1015  BP: 122/82  Pulse: 73  Resp: 14  Temp: 97.9 F (36.6 C)  TempSrc: Temporal  SpO2: 98%  Weight:  241 lb 14.4 oz (109.7 kg)  Height: 5\' 10"  (1.778 m)    Body mass index is 34.71 kg/m.  Physical Exam Vitals and nursing note reviewed.  Constitutional:      General: He is not in acute distress.    Appearance: Normal appearance. He is well-developed. He is obese. He is not ill-appearing, toxic-appearing or diaphoretic.     Interventions: Face mask in place.  HENT:     Head: Normocephalic and atraumatic.     Jaw: No trismus.     Right Ear: External ear normal.     Left Ear: External ear normal.  Eyes:     General: Lids are normal. No scleral icterus.    Conjunctiva/sclera: Conjunctivae normal.     Pupils: Pupils are equal, round, and reactive to light.  Neck:     Trachea: Trachea and phonation normal. No tracheal deviation.  Cardiovascular:     Rate and Rhythm: Normal rate and regular rhythm.     Pulses: Normal pulses.          Radial pulses are 2+ on the right side and 2+ on the left side.       Posterior tibial pulses are 2+ on the right side and 2+ on the left side.     Heart  sounds: Normal heart sounds. No murmur. No friction rub. No gallop.   Pulmonary:     Effort: Pulmonary effort is normal. No respiratory distress.     Breath sounds: Normal breath sounds. No stridor. No wheezing, rhonchi or rales.  Abdominal:     General: Bowel sounds are normal. There is no distension or abdominal bruit.     Palpations: Abdomen is soft. There is no hepatomegaly, splenomegaly, mass or pulsatile mass.     Tenderness: There is abdominal tenderness in the right upper quadrant and epigastric area. There is no right CVA tenderness, left CVA tenderness, guarding or rebound. Negative signs include Murphy's sign and McBurney's sign.     Hernia: No hernia is present.  Musculoskeletal:        General: Normal range of motion.     Cervical back: Normal range of motion and neck supple.     Right lower leg: No edema.     Left lower leg: No edema.  Skin:    General: Skin is warm and dry.     Capillary Refill: Capillary refill takes less than 2 seconds.     Coloration: Skin is not jaundiced.     Findings: No rash.     Nails: There is no clubbing.  Neurological:     Mental Status: He is alert.     Cranial Nerves: No dysarthria or facial asymmetry.     Motor: No tremor or abnormal muscle tone.     Gait: Gait normal.  Psychiatric:        Mood and Affect: Mood normal.        Speech: Speech normal.        Behavior: Behavior normal. Behavior is cooperative.      PHQ2/9: Depression screen [facility] 2/9 05/27/2019 09/16/2018 08/26/2018 09/20/2017 02/21/2017  Decreased Interest 0 0 0 0 0  Down, Depressed, Hopeless 0 0 0 0 0  PHQ - 2 Score 0 0 0 0 0  Altered sleeping 0 0 0 - -  Tired, decreased energy 0 0 0 - -  Change in appetite 0 0 0 - -  Feeling bad or failure about yourself  0 0 0 - -  Trouble  concentrating 0 0 0 - -  Moving slowly or fidgety/restless 0 0 0 - -  Suicidal thoughts 0 0 0 - -  PHQ-9 Score 0 0 0 - -  Difficult doing work/chores Not difficult at all - - - -    phq 9 is  negative, reviewed today  Fall Risk: Fall Risk  05/27/2019 09/16/2018 08/26/2018 09/20/2017 02/21/2017  Falls in the past year? 0 0 0 No No  Number falls in past yr: 0 0 0 - -  Injury with Fall? 0 0 0 - -    Functional Status Survey: Is the patient deaf or have difficulty hearing?: No Does the patient have difficulty seeing, even when wearing glasses/contacts?: No Does the patient have difficulty concentrating, remembering, or making decisions?: No Does the patient have difficulty walking or climbing stairs?: No Does the patient have difficulty dressing or bathing?: No Does the patient have difficulty doing errands alone such as visiting a doctor's office or shopping?: No   Assessment & Plan:   1. 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  Dispense: 180 tablet; Refill: 1 - CARBATROL 200 MG 12 hr capsule; Take 2 capsules int he morning and 3 capsules in the evening  Dispense: 450 capsule; Refill: 1 - Valproic Acid level - Carbamazepine Level (Tegretol), total  2. Intermittent upper abdominal pain Seems to have epigastric or generalized upper abdominal pain triggered by greasy and red meats has been intermittent for about 10 months was much more severe around the holidays and has been somewhat improved the last month although he has avoided his food triggers and that seems to be the main alleviating factor. We will check labs to ensure digestive enzymes and liver function and lipase are normal, check CBC, currently doubt cholecystitis but could be possibly cholelithiasis, GERD and gastritis are also in the differential, he is also able to eat a variety of other meats including poultry and seafood that seems to be okay even if it is cooked  in oils and grease so he may have allergy to red meat or alpha gal?   I have asked him to do a Protonix trial since he was only taking Protonix about once a week He does have some bloating associated with his pain so we will try Reglan to treat symptoms -to promote GI motility - US Abdomen Limited RUQ - pantoprazole (PROTONIX) 40 MG tablet; Take 1 tablet (40 mg total) by mouth daily. In am before breakfast  Dispense: 60 tablet; Refill: 1 - metoCLOPramide (REGLAN) 10 MG tablet; Take 1 tablet (10 mg total) by mouth every 8 (eight) hours as needed for nausea or vomiting (abdominal bloating).  Dispense: 60 tablet; Refill: 1 - CMP w GFR - CBC w/ Diff - Lipase  3. Recurrent biliary colic Suspected biliary colic rule out cholelithiasis, may also be biliary dyskinesia discussed further test if the ultrasound is negative or referral to GI if work-up is unremarkable - US Abdomen Limited RUQ - CMP w GFR - CBC w/ Diff - Lipase  4. Gastroesophageal reflux disease with esophagitis, unspecified whether hemorrhage - pantoprazole (PROTONIX) 40 MG tablet; Take 1 tablet (40 mg total) by mouth daily. In am before breakfast  Dispense: 60  tablet; Refill: 1  5. Encounter for medication monitoring - Valproic Acid level - Carbamazepine Level (Tegretol), total      Danelle Berry, PA-C 05/27/19 10:44 AM

## 2019-06-11 LAB — CBC WITH DIFFERENTIAL/PLATELET
Absolute Monocytes: 414 cells/uL (ref 200–950)
Basophils Absolute: 31 cells/uL (ref 0–200)
Basophils Relative: 0.7 %
Eosinophils Absolute: 22 cells/uL (ref 15–500)
Eosinophils Relative: 0.5 %
HCT: 43.3 % (ref 38.5–50.0)
Hemoglobin: 14.4 g/dL (ref 13.2–17.1)
Lymphs Abs: 1558 cells/uL (ref 850–3900)
MCH: 28.7 pg (ref 27.0–33.0)
MCHC: 33.3 g/dL (ref 32.0–36.0)
MCV: 86.3 fL (ref 80.0–100.0)
MPV: 11.1 fL (ref 7.5–12.5)
Monocytes Relative: 9.4 %
Neutro Abs: 2376 cells/uL (ref 1500–7800)
Neutrophils Relative %: 54 %
Platelets: 157 10*3/uL (ref 140–400)
RBC: 5.02 10*6/uL (ref 4.20–5.80)
RDW: 12.8 % (ref 11.0–15.0)
Total Lymphocyte: 35.4 %
WBC: 4.4 10*3/uL (ref 3.8–10.8)

## 2019-06-11 LAB — COMPLETE METABOLIC PANEL WITH GFR
AG Ratio: 1.7 (calc) (ref 1.0–2.5)
ALT: 24 U/L (ref 9–46)
AST: 21 U/L (ref 10–40)
Albumin: 4.5 g/dL (ref 3.6–5.1)
Alkaline phosphatase (APISO): 65 U/L (ref 36–130)
BUN: 18 mg/dL (ref 7–25)
CO2: 28 mmol/L (ref 20–32)
Calcium: 9.3 mg/dL (ref 8.6–10.3)
Chloride: 105 mmol/L (ref 98–110)
Creat: 0.98 mg/dL (ref 0.60–1.35)
GFR, Est African American: 111 mL/min/{1.73_m2} (ref >=60)
GFR, Est Non African American: 96 mL/min/{1.73_m2} (ref >=60)
Globulin: 2.6 g/dL (calc) (ref 1.9–3.7)
Glucose, Bld: 100 mg/dL — ABNORMAL HIGH (ref 65–99)
Potassium: 4.1 mmol/L (ref 3.5–5.3)
Sodium: 140 mmol/L (ref 135–146)
Total Bilirubin: 0.2 mg/dL (ref 0.2–1.2)
Total Protein: 7.1 g/dL (ref 6.1–8.1)

## 2019-06-11 LAB — VALPROIC ACID LEVEL: Valproic Acid Lvl: 55.1 mg/L (ref 50.0–100.0)

## 2019-06-11 LAB — CARBAMAZEPINE LEVEL, TOTAL: Carbamazepine Lvl: 9.3 mg/L (ref 4.0–12.0)

## 2019-06-11 LAB — LIPASE: Lipase: 24 U/L (ref 7–60)

## 2019-06-21 ENCOUNTER — Other Ambulatory Visit: Payer: Self-pay | Admitting: Family Medicine

## 2019-06-21 DIAGNOSIS — G40309 Generalized idiopathic epilepsy and epileptic syndromes, not intractable, without status epilepticus: Secondary | ICD-10-CM

## 2019-06-21 DIAGNOSIS — G40409 Other generalized epilepsy and epileptic syndromes, not intractable, without status epilepticus: Secondary | ICD-10-CM

## 2019-12-01 ENCOUNTER — Other Ambulatory Visit: Payer: Self-pay | Admitting: Family Medicine

## 2019-12-01 DIAGNOSIS — G40409 Other generalized epilepsy and epileptic syndromes, not intractable, without status epilepticus: Secondary | ICD-10-CM

## 2019-12-04 ENCOUNTER — Encounter: Payer: Self-pay | Admitting: Family Medicine

## 2019-12-04 ENCOUNTER — Other Ambulatory Visit: Payer: Self-pay

## 2019-12-04 ENCOUNTER — Ambulatory Visit: Payer: Managed Care, Other (non HMO) | Admitting: Family Medicine

## 2019-12-04 VITALS — BP 122/80 | HR 81 | Temp 97.9°F | Resp 18 | Ht 70.0 in | Wt 246.7 lb

## 2019-12-04 DIAGNOSIS — Z5181 Encounter for therapeutic drug level monitoring: Secondary | ICD-10-CM | POA: Diagnosis not present

## 2019-12-04 DIAGNOSIS — G40409 Other generalized epilepsy and epileptic syndromes, not intractable, without status epilepticus: Secondary | ICD-10-CM

## 2019-12-04 DIAGNOSIS — E669 Obesity, unspecified: Secondary | ICD-10-CM

## 2019-12-04 DIAGNOSIS — G40309 Generalized idiopathic epilepsy and epileptic syndromes, not intractable, without status epilepticus: Secondary | ICD-10-CM | POA: Diagnosis not present

## 2019-12-04 DIAGNOSIS — R7989 Other specified abnormal findings of blood chemistry: Secondary | ICD-10-CM

## 2019-12-04 DIAGNOSIS — E782 Mixed hyperlipidemia: Secondary | ICD-10-CM

## 2019-12-04 MED ORDER — CARBATROL 200 MG PO CP12: ORAL_CAPSULE | ORAL | 1 refills | Status: AC

## 2019-12-04 NOTE — Progress Notes (Signed)
Name: Pedro [name]   MRN: [id number]    DOB: Nov 28, 1980   Date:12/04/2019       Progress Note  Chief Complaint  Patient presents with  . Medication Refill     Subjective:   Pedro [name] is a 39 y.o. male, presents to clinic for med refills - he has hx of seizures, last was over 15 years ago - he is not currently est with neurology.  He is on  carbatrol 200 mg - taking 400 mg in am and 600 mg in pm, and depakote 1000 mg at bedtime.  No siezure activity No med SE He was previously having GI sx, those sx improved after about a month, he took PPI and was able to stop taking.  He has not had any GI sx or concerns since.     Pt previously asked for med refill, and it was noted that he has high cholesterol - encouraged to return for CPE and f/up on HLD.  He has only come due to running out of meds.   Hyperlipidemia: Not on any meds Last Lipids: Lab Results  Component Value Date   CHOL 226 (H) 04/30/2018   HDL 75 04/30/2018   LDLCALC 137 (H) 04/30/2018   TRIG 53 04/30/2018   CHOLHDL 3.0 04/30/2018   - Denies: Chest pain, shortness of breath, myalgias, claudication       Current Outpatient Medications:  .  calcium carbonate (TUMS - DOSED IN MG ELEMENTAL CALCIUM) 500 MG chewable tablet, Chew 1 tablet by mouth as needed. , Disp: , Rfl:  .  DEPAKOTE ER 500 MG 24 hr tablet, TAKE 2 TABLETS (1,000 MG TOTAL) BY MOUTH AT BEDTIME., Disp: 180 tablet, Rfl: 3 .  pantoprazole (PROTONIX) 40 MG tablet, Take 1 tablet (40 mg total) by mouth daily. In am before breakfast, Disp: 60 tablet, Rfl: 1 .  Pediatric Multiple Vitamins (FLINTSTONES MULTIVITAMIN PO), Take by mouth., Disp: , Rfl:  .  CARBATROL 200 MG 12 hr capsule, Take 2 capsules in the morning and 3 capsules in the evening, Disp: 450 capsule, Rfl: 1  Patient Active Problem List   Diagnosis Date Noted  . Allergic conjunctivitis 09/20/2017  . Obesity (BMI 35.0-39.9 without comorbidity) 02/21/2017  . Hyperlipidemia 07/31/2016    . Admission for therapeutic drug monitoring 07/31/2016  . Seborrheic keratosis 06/08/2016  . Screening examination for STD (sexually transmitted disease) 06/08/2016  . Vitamin D deficiency 02/01/2016  . Encounter for medication monitoring 02/01/2016  . 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 Bulge of lumbar disc without myelopathy 01/26/2015    Past Surgical History:  Procedure Laterality Date  . WISDOM TOOTH EXTRACTION  04/21/2016    Family History  Problem Relation Age of Onset  . Seizures Mother   . Hypertension Mother   . Hyperlipidemia Father   . Hypertension Father   . Hypertension Sister   . Depression Brother     Social History   Tobacco Use  . Smoking status: Former Smoker    Years: 5.00    Types: Cigarettes    Start date: 05/16/1995    Quit date: 05/16/1999    Years since quitting: 20.5  . Smokeless tobacco: Never Used  Vaping Use  . Vaping Use: Never used  Substance Use Topics  . Alcohol use: Yes    Alcohol/week: 2.0 - 3.0 standard drinks    Types: 2 - 3 Cans of beer per week  Comment:  maybe 2-3 cans of beer a day  . Drug use: No     Allergies  Allergen Reactions  . Caffeine   . Nicotine     Health Maintenance  Topic Date Due  . COVID-19 Vaccine (1) Never done  . INFLUENZA VACCINE  12/14/2019  . TETANUS/TDAP  08/03/2025  . Hepatitis C Screening  Completed  . HIV Screening  Completed    Chart Review Today: I personally reviewed active problem list, medication list, allergies, family history, social history, health maintenance, notes from last encounter, lab results, imaging with the patient/caregiver today.   Review of Systems  10 Systems reviewed and are negative for acute change except as noted in the HPI.     Objective:   Vitals:   12/04/19 1323  BP: 122/80  Pulse: 81  Resp: 18  Temp: 97.9 F (36.6 C)  TempSrc: Temporal  SpO2: 99%  Weight: (!) 246 lb 11.2 oz  (111.9 kg)  Height: 5\' 10"  (1.778 m)    Body mass index is 35.4 kg/m.  Physical Exam Vitals and nursing note reviewed.  Constitutional:      General: He is not in acute distress.    Appearance: Normal appearance. He is well-developed. He is obese. He is not ill-appearing, toxic-appearing or diaphoretic.     Interventions: Face mask in place.  HENT:     Head: Normocephalic and atraumatic.     Jaw: No trismus.     Right Ear: External ear normal.     Left Ear: External ear normal.  Eyes:     General: Lids are normal. No scleral icterus.    Conjunctiva/sclera: Conjunctivae normal.     Pupils: Pupils are equal, round, and reactive to light.  Neck:     Trachea: Trachea and phonation normal. No tracheal deviation.  Cardiovascular:     Rate and Rhythm: Normal rate and regular rhythm.     Pulses: Normal pulses.          Radial pulses are 2+ on the right side and 2+ on the left side.       Posterior tibial pulses are 2+ on the right side and 2+ on the left side.     Heart sounds: Normal heart sounds. No murmur heard.  No friction rub. No gallop.   Pulmonary:     Effort: Pulmonary effort is normal. No respiratory distress.     Breath sounds: Normal breath sounds. No stridor. No wheezing, rhonchi or rales.  Abdominal:     General: Bowel sounds are normal. There is no distension.     Palpations: Abdomen is soft.     Tenderness: There is no abdominal tenderness. There is no guarding or rebound.  Musculoskeletal:        General: Normal range of motion.     Cervical back: Normal range of motion and neck supple.     Right lower leg: No edema.     Left lower leg: No edema.  Skin:    General: Skin is warm and dry.     Capillary Refill: Capillary refill takes less than 2 seconds.     Coloration: Skin is not jaundiced.     Findings: No rash.     Nails: There is no clubbing.  Neurological:     Mental Status: He is alert.     Cranial Nerves: No dysarthria or facial asymmetry.     Motor:  No tremor or abnormal muscle tone.     Gait: Gait normal.  Psychiatric:        Mood and Affect: Mood normal.        Speech: Speech normal.        Behavior: Behavior normal. Behavior is cooperative.         Assessment & Plan:     ICD-10-CM   1. 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  2. Medication monitoring encounter  Z51.81 Carbamazepine Level (Tegretol), total    Valproic Acid level    CBC with Differential/Platelet    COMPLETE METABOLIC PANEL WITH GFR    Lipid panel    TSH    Iron, TIBC and Ferritin Panel    Reticulocytes    Urinalysis, Routine w reflex microscopic   Reviewed with patient his medications and recommended monitoring and labs- explained I would appeciate est with neuro for specialists to help monitor  3. Therapeutic drug monitoring  Z51.81 Carbamazepine Level (Tegretol), total    Valproic Acid level    CBC with Differential/Platelet    COMPLETE METABOLIC PANEL WITH GFR    Lipid panel    TSH    Iron, TIBC and Ferritin Panel    Reticulocytes    Urinalysis, Routine w reflex microscopic   levels for depakote and carbamazepine -on same meds for years, not seeing specialist  4. Obesity (BMI 35.0-39.9 without comorbidity)  E66.9    encouraged increased efforts to eat healthy foods, decrease calories and increase activity  5. Mixed hyperlipidemia  E78.2 Lipid panel   high cholesterol, monitor with his meds and family hx, discussed ASCVD risk and other indications to initiate statin medicaiton  6. Abnormal TSH  R79.89 TSH   hx of abnormal TSH in the past, pt was unaware, recheck with pts meds, weight, no recent changes in mood, weight,  energy     Return in about 6 months (around 06/05/2020) for Annual Physical.   Danelle Berry, PA-C 12/04/19 1:43 PM

## 2019-12-04 NOTE — Patient Instructions (Addendum)
Monitoring Parameters for carbamenzapine: Baseline and periodic: CBC with platelet count and differential, reticulocytes, serum iron, liver and renal function tests, urinalysis, BUN, serum sodium, ophthalmic exam including intraocular pressure. As appropriate: Lipid panel, serum carbamazepine levels, thyroid function tests;   Monitoring Parameters for valprLiver enzymes (at baseline and frequently during therapy especially during the first 6 months), CBC with platelets (baseline and periodic intervals), PT/PTT (especially prior to surgery), serum ammonia (with symptoms of unexplained lethargy and vomiting, hypothermia, or mental status change), serum valproate levels; signs and symptoms of hepatotoxicity (eg, malaise, weakness, facial edema, anorexia, jaundice, and vomiting; especially in patients >23 years of age who are clinically suspected of having a hereditary mitochondrial disease); signs and symptoms of pancreatitis (eg, abdominal pain, nausea, vomiting, and/or anorexia); suicidality (eg, suicidal thoughts, depression, behavioral changes); mental alertness; signs and symptoms of motor and cognitive function decline; evidence of hemorrhage, bruising, or a disorder of hemostasis/coagulation; signs and symptoms of drug reaction with eosinophilia and systemic symptoms (eg, possible disparate manifestations associated with lymphatic, hepatic, renal, and/or hematologic organ systems); menstrual history to assess for polycystic ovary syndrome (at 3 to 6 month intervals for the first year, then annually) (CANMAT [[place] 2018]).

## 2019-12-05 LAB — CBC WITH DIFFERENTIAL/PLATELET
Absolute Monocytes: 458 cells/uL (ref 200–950)
Basophils Absolute: 10 cells/uL (ref 0–200)
Basophils Relative: 0.2 %
Eosinophils Absolute: 21 cells/uL (ref 15–500)
Eosinophils Relative: 0.4 %
HCT: 46.8 % (ref 38.5–50.0)
Hemoglobin: 15.6 g/dL (ref 13.2–17.1)
Lymphs Abs: 1529 cells/uL (ref 850–3900)
MCH: 29.3 pg (ref 27.0–33.0)
MCHC: 33.3 g/dL (ref 32.0–36.0)
MCV: 87.8 fL (ref 80.0–100.0)
MPV: 10 fL (ref 7.5–12.5)
Monocytes Relative: 8.8 %
Neutro Abs: 3182 cells/uL (ref 1500–7800)
Neutrophils Relative %: 61.2 %
Platelets: 211 10*3/uL (ref 140–400)
RBC: 5.33 10*6/uL (ref 4.20–5.80)
RDW: 13.1 % (ref 11.0–15.0)
Total Lymphocyte: 29.4 %
WBC: 5.2 10*3/uL (ref 3.8–10.8)

## 2019-12-05 LAB — COMPLETE METABOLIC PANEL WITH GFR
AG Ratio: 1.6 (calc) (ref 1.0–2.5)
ALT: 16 U/L (ref 9–46)
AST: 18 U/L (ref 10–40)
Albumin: 4.6 g/dL (ref 3.6–5.1)
Alkaline phosphatase (APISO): 66 U/L (ref 36–130)
BUN: 14 mg/dL (ref 7–25)
CO2: 31 mmol/L (ref 20–32)
Calcium: 9.8 mg/dL (ref 8.6–10.3)
Chloride: 104 mmol/L (ref 98–110)
Creat: 0.91 mg/dL (ref 0.60–1.35)
GFR, Est African American: 121 mL/min/{1.73_m2} (ref >=60)
GFR, Est Non African American: 104 mL/min/{1.73_m2} (ref >=60)
Globulin: 2.8 g/dL (calc) (ref 1.9–3.7)
Glucose, Bld: 83 mg/dL (ref 65–99)
Potassium: 3.8 mmol/L (ref 3.5–5.3)
Sodium: 140 mmol/L (ref 135–146)
Total Bilirubin: 0.5 mg/dL (ref 0.2–1.2)
Total Protein: 7.4 g/dL (ref 6.1–8.1)

## 2019-12-05 LAB — LIPID PANEL
Cholesterol: 258 mg/dL — ABNORMAL HIGH (ref <200)
HDL: 80 mg/dL (ref >=40)
LDL Cholesterol (Calc): 152 mg/dL (calc) — ABNORMAL HIGH
Non-HDL Cholesterol (Calc): 178 mg/dL (calc) — ABNORMAL HIGH (ref <130)
Total CHOL/HDL Ratio: 3.2 (calc) (ref <5.0)
Triglycerides: 135 mg/dL (ref <150)

## 2019-12-05 LAB — URINALYSIS, ROUTINE W REFLEX MICROSCOPIC
Bilirubin Urine: NEGATIVE
Glucose, UA: NEGATIVE
Hgb urine dipstick: NEGATIVE
Ketones, ur: NEGATIVE
Leukocytes,Ua: NEGATIVE
Nitrite: NEGATIVE
Protein, ur: NEGATIVE
Specific Gravity, Urine: 1.015 (ref 1.001–1.03)
pH: 8 (ref 5.0–8.0)

## 2019-12-05 LAB — RETICULOCYTES
ABS Retic: 42640 cells/uL (ref 25000–9000)
Retic Ct Pct: 0.8 %

## 2019-12-05 LAB — IRON,TIBC AND FERRITIN PANEL
Ferritin: 142 ng/mL (ref 38–380)
Iron: 165 ug/dL (ref 50–180)

## 2019-12-05 LAB — CARBAMAZEPINE LEVEL, TOTAL: Carbamazepine Lvl: 8.8 mg/L (ref 4.0–12.0)

## 2019-12-05 LAB — TSH: TSH: 1.22 mIU/L (ref 0.40–4.50)

## 2019-12-05 LAB — VALPROIC ACID LEVEL: Valproic Acid Lvl: 51.5 mg/L (ref 50.0–100.0)

## 2020-01-14 DIAGNOSIS — R569 Unspecified convulsions: Secondary | ICD-10-CM | POA: Insufficient documentation

## 2020-01-26 DIAGNOSIS — R569 Unspecified convulsions: Secondary | ICD-10-CM | POA: Insufficient documentation

## 2020-04-28 ENCOUNTER — Other Ambulatory Visit: Payer: Self-pay | Admitting: Family Medicine

## 2020-04-28 DIAGNOSIS — G40409 Other generalized epilepsy and epileptic syndromes, not intractable, without status epilepticus: Secondary | ICD-10-CM

## 2020-04-28 NOTE — Telephone Encounter (Signed)
Copied from CRM [id number]. Topic: Quick Communication - Rx Refill/Question >> Apr 28, 2020  4:10 PM Jaquita Rector A wrote: Medication: CARBATROL 200 MG 12 hr capsule   Has the patient contacted their pharmacy? Yes.   (Agent: If no, request that the patient contact the pharmacy for the refill.) (Agent: If yes, when and what did the pharmacy advise?)  Preferred Pharmacy (with phone number or street name): [id number] [place] [address]  Phone:  [phone] Fax:  [phone]     Agent: Please be advised that RX refills may take up to 3 business days. We ask that you follow-up with your pharmacy.

## 2020-04-30 NOTE — Telephone Encounter (Signed)
Pt established with Dr. Potter/neurology for management - seen 3x in the past 4 months - Dr. Malvin Johns took over meds and they were sent in x 6 months - pt should check with their pharmacy to get the rx from Dr. Malvin Johns filled - and any seizure meds and/or adjustments should be per managing specialists -

## 2020-05-03 NOTE — Telephone Encounter (Signed)
Left message for patient to check pharmacy.

## 2020-05-18 ENCOUNTER — Ambulatory Visit: Payer: Managed Care, Other (non HMO) | Admitting: Family Medicine

## 2020-05-25 ENCOUNTER — Encounter: Payer: Self-pay | Admitting: Family Medicine

## 2020-05-25 ENCOUNTER — Other Ambulatory Visit: Payer: Self-pay

## 2020-05-25 ENCOUNTER — Ambulatory Visit (INDEPENDENT_AMBULATORY_CARE_PROVIDER_SITE_OTHER): Payer: Managed Care, Other (non HMO) | Admitting: Family Medicine

## 2020-05-25 VITALS — BP 128/84 | HR 72 | Temp 98.3°F | Resp 18 | Ht 70.0 in | Wt 254.2 lb

## 2020-05-25 DIAGNOSIS — E669 Obesity, unspecified: Secondary | ICD-10-CM

## 2020-05-25 DIAGNOSIS — G40309 Generalized idiopathic epilepsy and epileptic syndromes, not intractable, without status epilepticus: Secondary | ICD-10-CM | POA: Insufficient documentation

## 2020-05-25 DIAGNOSIS — M79671 Pain in right foot: Secondary | ICD-10-CM

## 2020-05-25 DIAGNOSIS — E785 Hyperlipidemia, unspecified: Secondary | ICD-10-CM

## 2020-05-25 MED ORDER — MELOXICAM 15 MG PO TABS: 15.0000 mg | ORAL_TABLET | Freq: Every day | ORAL | 1 refills | Status: DC | PRN

## 2020-05-25 NOTE — Patient Instructions (Signed)
Plantar Fasciitis  Plantar fasciitis is a painful foot condition that affects the heel. It occurs when the band of tissue that connects the toes to the heel bone (plantar fascia) becomes irritated. This can happen as the result of exercising too much or doing other repetitive activities (overuse injury). Plantar fasciitis can cause mild irritation to severe pain that makes it difficult to walk or move. The pain is usually worse in the morning after sleeping, or after sitting or lying down for a period of time. Pain may also be worse after long periods of walking or standing. What are the causes? This condition may be caused by:  Standing for long periods of time.  Wearing shoes that do not have good arch support.  Doing activities that put stress on joints (high-impact activities). This includes ballet and exercise that makes your heart beat faster (aerobic exercise), such as running.  Being overweight.  An abnormal way of walking (gait).  Tight muscles in the back of your lower leg (calf).  High arches in your feet or flat feet.  Starting a new athletic activity. What are the signs or symptoms? The main symptom of this condition is heel pain. Pain may get worse after the following:  Taking the first steps after a time of rest, especially in the morning after awakening, or after you have been sitting or lying down for a while.  Long periods of standing still. Pain may decrease after 30-45 minutes of activity, such as gentle walking. How is this diagnosed? This condition may be diagnosed based on your medical history, a physical exam, and your symptoms. Your health care provider will check for:  A tender area on the bottom of your foot.  A high arch in your foot or flat feet.  Pain when you move your foot.  Difficulty moving your foot. You may have imaging tests to confirm the diagnosis, such as:  X-rays.  Ultrasound.  MRI. How is this treated? Treatment for plantar  fasciitis depends on how severe your condition is. Treatment may include:  Rest, ice, pressure (compression), and raising (elevating) the affected foot. This is called RICE therapy. Your health care provider may recommend RICE therapy along with over-the-counter pain medicines to manage your pain.  Exercises to stretch your calves and your plantar fascia.  A splint that holds your foot in a stretched, upward position while you sleep (night splint).  Physical therapy to relieve symptoms and prevent problems in the future.  Injections of steroid medicine (cortisone) to relieve pain and inflammation.  Stimulating your plantar fascia with electrical impulses (extracorporeal shock wave therapy). This is usually the last treatment option before surgery.  Surgery, if other treatments have not worked after 12 months. Follow these instructions at home: Managing pain, stiffness, and swelling  If directed, put ice on the painful area. To do this: ? Put ice in a plastic bag, or use a frozen bottle of water. ? Place a towel between your skin and the bag or bottle. ? Roll the bottom of your foot over the bag or bottle. ? Do this for 20 minutes, 2-3 times a day.  Wear athletic shoes that have air-sole or gel-sole cushions, or try soft shoe inserts that are designed for plantar fasciitis.  Elevate your foot above the level of your heart while you are sitting or lying down.   Activity  Avoid activities that cause pain. Ask your health care provider what activities are safe for you.  Do physical therapy exercises   and stretches as told by your health care provider.  Try activities and forms of exercise that are easier on your joints (low impact). Examples include swimming, water aerobics, and biking. General instructions  Take over-the-counter and prescription medicines only as told by your health care provider.  Wear a night splint while sleeping, if told by your health care provider. Loosen the  splint if your toes tingle, become numb, or turn cold and blue.  Maintain a healthy weight, or work with your health care provider to lose weight as needed.  Keep all follow-up visits. This is important. Contact a health care provider if you have:  Symptoms that do not go away with home treatment.  Pain that gets worse.  Pain that affects your ability to move or do daily activities. Summary  Plantar fasciitis is a painful foot condition that affects the heel. It occurs when the band of tissue that connects the toes to the heel bone (plantar fascia) becomes irritated.  Heel pain is the main symptom of this condition. It may get worse after exercising too much or standing still for a long time.  Treatment varies, but it usually starts with rest, ice, pressure (compression), and raising (elevating) the affected foot. This is called RICE therapy. Over-the-counter medicines can also be used to manage pain. This information is not intended to replace advice given to you by your health care provider. Make sure you discuss any questions you have with your health care provider. Document Revised: 08/18/2019 Document Reviewed: 08/18/2019 [employer] Patient Education  2021 [employer]  

## 2020-05-25 NOTE — Progress Notes (Signed)
Patient ID: Pedro [name], male    DOB: 1980/09/08, 40 y.o.   MRN: [id number]  PCP: Danelle Berry, PA-C  Chief Complaint  Patient presents with  . Foot Pain    On and off for 6 months, right foot worst then left    Subjective:   Pedro [name] is a 40 y.o. male, presents to clinic with CC of the following:  HPI  Patient presents with about 6 months of on and off right plantar foot pain. He wears old work boots and started driving a motorcycle and he thinks these might have been related to when his pain started.  He is also gained some weight. Pain is located in the arch of his foot, has been moderate to severe in nature, but over the last week has somewhat improved.  His pain is much worse first in the morning and after any periods of immobility.   He bought new work boots and since then over the past week it seems to gradually begin to improve. He denies associated redness, edema, weakness, deformity, numbness  no recent or remote injury, no history of gout or arthritis.  History of seizure disorder, patient presented here for med refills over the past year and did recently establish with neurology With Dr. Malvin Johns at [place] he had several visits and testing and he confirmed diagnosis of primary generalized seizure disorder, Dr. Malvin Johns reviewed our prior labs and monitoring of his Depakote and carbamazepine -1000 mg Depakote in the evening and 400 carbamazepine in the a.m. and 600 carbamazepine in the p.m., patient will follow-up with Dr. Malvin Johns in 6 months, onset of seizures at age 39 with no seizures for the past 16 years  Obesity: Patient states that he has gained weight over the holidays his eat more and exercise less He is getting back to exercising and is planning on losing his weight Wt Readings from Last 5 Encounters:  05/25/20 254 lb 3.2 oz (115.3 kg)  12/04/19 (!) 246 lb 11.2 oz (111.9 kg)  05/27/19 241 lb 14.4 oz (109.7 kg)  09/16/18 220 lb (99.8 kg)  08/26/18  225 lb 9.6 oz (102.3 kg)   BMI Readings from Last 5 Encounters:  05/25/20 36.47 kg/m  12/04/19 35.40 kg/m  05/27/19 34.71 kg/m  09/16/18 31.57 kg/m  08/26/18 32.37 kg/m   Hyperlipidemia: Not currently on any medications Last Lipids: Lab Results  Component Value Date   CHOL 258 (H) 12/04/2019   HDL 80 12/04/2019   LDLCALC 152 (H) 12/04/2019   TRIG 135 12/04/2019   CHOLHDL 3.2 12/04/2019  The 10-year ASCVD risk score Denman George DC Jr., et al., 2013) is: 3.1%   Values used to calculate the score:     Age: 22 years     Sex: Male     Is Non-Hispanic African American: Yes     Diabetic: No     Tobacco smoker: No     Systolic Blood Pressure: 128 mmHg     Is BP treated: No     HDL Cholesterol: 80 mg/dL     Total Cholesterol: 258 mg/dL  - Denies: Chest pain, shortness of breath, myalgias, claudication Managing with diet lifestyle efforts     Patient Active Problem List   Diagnosis Date Noted  . Allergic conjunctivitis 09/20/2017  . Obesity (BMI 35.0-39.9 without comorbidity) 02/21/2017  . Hyperlipidemia 07/31/2016  . Admission for therapeutic drug monitoring 07/31/2016  . Seborrheic keratosis 06/08/2016  . Screening examination for STD (sexually transmitted disease) 06/08/2016  .  Vitamin D deficiency 02/01/2016  . Encounter for medication monitoring 02/01/2016  . 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 Epilepsy (HCC) 03/17/2013      Current Outpatient Medications:  .  CARBATROL 200 MG 12 hr capsule, Take 2 capsules in the morning and 3 capsules in the evening, Disp: 450 capsule, Rfl: 1 .  DEPAKOTE ER 500 MG 24 hr tablet, TAKE 2 TABLETS (1,000 MG TOTAL) BY MOUTH AT BEDTIME., Disp: 180 tablet, Rfl: 3 .  Pediatric Multiple Vitamins (FLINTSTONES MULTIVITAMIN PO), Take by mouth., Disp: , Rfl:    Allergies  Allergen Reactions  . Caffeine   . Nicotine      Social History   Tobacco Use  . Smoking status: Former Smoker     Years: 5.00    Types: Cigarettes    Start date: 05/16/1995    Quit date: 05/16/1999    Years since quitting: 21.0  . Smokeless tobacco: Never Used  Vaping Use  . Vaping Use: Never used  Substance Use Topics  . Alcohol use: Yes    Alcohol/week: 2.0 - 3.0 standard drinks    Types: 2 - 3 Cans of beer per week    Comment:  maybe 2-3 cans of beer a day  . Drug use: No      Chart Review Today: I personally reviewed active problem list, medication list, allergies, family history, social history, health maintenance, notes from last encounter, lab results, imaging with the patient/caregiver today.   Review of Systems All other systems reviewed and are negative for acute change except as noted in the HPI.     Objective:   Vitals:   05/25/20 1114  BP: 128/84  Pulse: 72  Resp: 18  Temp: 98.3 F (36.8 C)  TempSrc: Oral  SpO2: 99%  Weight: 254 lb 3.2 oz (115.3 kg)  Height: 5\' 10"  (1.778 m)    Body mass index is 36.47 kg/m.  Physical Exam Vitals and nursing note reviewed.  Constitutional:      General: He is not in acute distress.    Appearance: Normal appearance. He is obese. He is not ill-appearing, toxic-appearing or diaphoretic.  HENT:     Head: Normocephalic and atraumatic.  Eyes:     General:        Right eye: No discharge.        Left eye: No discharge.     Conjunctiva/sclera: Conjunctivae normal.  Cardiovascular:     Rate and Rhythm: Normal rate and regular rhythm.     Pulses: Normal pulses.          Dorsalis pedis pulses are 2+ on the right side and 2+ on the left side.       Posterior tibial pulses are 2+ on the right side and 2+ on the left side.     Heart sounds: Normal heart sounds. No murmur heard. No friction rub. No gallop.   Pulmonary:     Effort: Pulmonary effort is normal. No respiratory distress.     Breath sounds: Normal breath sounds. No wheezing, rhonchi or rales.  Chest:     Chest wall: No tenderness.  Abdominal:     General: Bowel sounds  are normal.     Palpations: Abdomen is soft.  Musculoskeletal:     Right foot: Normal range of motion and normal capillary refill. Tenderness present. No swelling, deformity, prominent metatarsal heads, bony tenderness or crepitus. Normal pulse.     Left foot: Normal range  of motion. No deformity.     Comments: Right plantar foot-  tender to palpation in mid arch  Feet:     Right foot:     Skin integrity: Skin integrity normal.     Toenail Condition: Right toenails are normal.     Left foot:     Skin integrity: Skin integrity normal.     Toenail Condition: Left toenails are normal.  Skin:    General: Skin is warm and dry.     Capillary Refill: Capillary refill takes less than 2 seconds.     Coloration: Skin is not jaundiced or pale.     Findings: No bruising, erythema or lesion.  Neurological:     General: No focal deficit present.     Mental Status: He is alert.     Gait: Gait normal.  Psychiatric:        Mood and Affect: Mood normal.        Behavior: Behavior normal.      Results for orders placed or performed in visit on 12/04/19  Carbamazepine Level (Tegretol), total  Result Value Ref Range   Carbamazepine Lvl 8.8 4.0 - 12.0 mg/L  Valproic Acid level  Result Value Ref Range   Valproic Acid Lvl 51.5 50.0 - 100.0 mg/L  CBC with Differential/Platelet  Result Value Ref Range   WBC 5.2 3.8 - 10.8 Thousand/uL   RBC 5.33 4.20 - 5.80 Million/uL   Hemoglobin 15.6 13.2 - 17.1 g/dL   HCT 24.2 68.3 - 41.9 %   MCV 87.8 80.0 - 100.0 fL   MCH 29.3 27.0 - 33.0 pg   MCHC 33.3 32.0 - 36.0 g/dL   RDW 62.2 29.7 - 98.9 %   Platelets 211 140 - 400 Thousand/uL   MPV 10.0 7.5 - 12.5 fL   Neutro Abs 3,182 1,500 - 7,800 cells/uL   Lymphs Abs 1,529 850 - 3,900 cells/uL   Absolute Monocytes 458 200 - 950 cells/uL   Eosinophils Absolute 21 15 - 500 cells/uL   Basophils Absolute 10 0 - 200 cells/uL   Neutrophils Relative % 61.2 %   Total Lymphocyte 29.4 %   Monocytes Relative 8.8 %    Eosinophils Relative 0.4 %   Basophils Relative 0.2 %  COMPLETE METABOLIC PANEL WITH GFR  Result Value Ref Range   Glucose, Bld 83 65 - 99 mg/dL   BUN 14 7 - 25 mg/dL   Creat 2.11 9.41 - 7.40 mg/dL   GFR, Est Non African American 104 > OR = 60 mL/min/1.62m2   GFR, Est African American 121 > OR = 60 mL/min/1.73m2   BUN/Creatinine Ratio NOT APPLICABLE 6 - 22 (calc)   Sodium 140 135 - 146 mmol/L   Potassium 3.8 3.5 - 5.3 mmol/L   Chloride 104 98 - 110 mmol/L   CO2 31 20 - 32 mmol/L   Calcium 9.8 8.6 - 10.3 mg/dL   Total Protein 7.4 6.1 - 8.1 g/dL   Albumin 4.6 3.6 - 5.1 g/dL   Globulin 2.8 1.9 - 3.7 g/dL (calc)   AG Ratio 1.6 1.0 - 2.5 (calc)   Total Bilirubin 0.5 0.2 - 1.2 mg/dL   Alkaline phosphatase (APISO) 66 36 - 130 U/L   AST 18 10 - 40 U/L   ALT 16 9 - 46 U/L  Lipid panel  Result Value Ref Range   Cholesterol 258 (H) <200 mg/dL   HDL 80 > OR = 40 mg/dL   Triglycerides 814 <481 mg/dL   LDL  Cholesterol (Calc) 152 (H) mg/dL (calc)   Total CHOL/HDL Ratio 3.2 <5.0 (calc)   Non-HDL Cholesterol (Calc) 178 (H) <130 mg/dL (calc)  TSH  Result Value Ref Range   TSH 1.22 0.40 - 4.50 mIU/L  Iron, TIBC and Ferritin Panel  Result Value Ref Range   Iron 165 50 - 180 mcg/dL   TIBC CANCELED    Ferritin 142 38 - 380 ng/mL  Reticulocytes  Result Value Ref Range   Retic Ct Pct 0.8 %   ABS Retic 42,640 25,000 - 9,000 cells/uL  Urinalysis, Routine w reflex microscopic  Result Value Ref Range   Color, Urine YELLOW YELLOW   APPearance CLEAR CLEAR   Specific Gravity, Urine 1.015 1.001 - 1.03   pH 8.0 5.0 - 8.0   Glucose, UA NEGATIVE NEGATIVE   Bilirubin Urine NEGATIVE NEGATIVE   Ketones, ur NEGATIVE NEGATIVE   Hgb urine dipstick NEGATIVE NEGATIVE   Protein, ur NEGATIVE NEGATIVE   Nitrite NEGATIVE NEGATIVE   Leukocytes,Ua NEGATIVE NEGATIVE       Assessment & Plan:     ICD-10-CM   1. Right foot pain  M79.671 meloxicam (MOBIC) 15 MG tablet   Suspect plantar fasciitis -reviewed  conservative management, handout given, encourage patient to follow-up in the next month or 2 if he wants podiatry referral  2. Obesity (BMI 35.0-39.9 without comorbidity)  E66.9    Encouraged healthy diet, reduce portions and calorie intake and increased physical activity, some weight loss will likely help with his plantar fasciitis  3. Hyperlipidemia, unspecified hyperlipidemia type  E78.5    Lipids elevated last July, weight has increased we will recheck at next CPE encourage diet lifestyle efforts, currently low risk and statin is not indicated  4. Nonintractable generalized idiopathic epilepsy without status epilepticus (HCC)  G40.309    Patient [facility] -he is continuing Depakote and carbamazepine at same doses, no seizure activity   Management of right plantar foot pain which I suspect is plantar fasciitis encouraged him to do RICE therapy, gentle stretching, can try Mobic he is currently trying Tylenol arthritis over-the-counter.  Reviewed with him different braces and inserts that may help reduce his pain -since he bought new work boots he does seem to have improving symptoms and his symptoms may likely resolve in the next 1 to 2 months.  If he is able to lose some of his weight that he is gained that may also help as well  No injury or deformity did not feel x-rays were indicated today  Encouraged the patient to reach out to me in 1 to 2 months if he has continued symptoms and would like to be referred to the podiatrist, at this time he would like to wait and see and he declined to have me refer him today  For dx 2-4 above I did personally review the patient's lab work, his recent office visits, labs, procedures and results through care everywhere today, spent more than 10 minutes with chart and lab review. More than 10 additional minutes for documentation/charting. AVS given with additional tx recommendations, reviewed with pt at time of visit.  f/up  CPE/routine OV in ~6 months    Danelle BerryLeisa Bricen Victory, PA-C 05/25/20 11:25 AM

## 2020-08-06 ENCOUNTER — Other Ambulatory Visit: Payer: Self-pay

## 2020-08-06 ENCOUNTER — Ambulatory Visit: Payer: Managed Care, Other (non HMO) | Admitting: Physician Assistant

## 2020-08-06 ENCOUNTER — Encounter: Payer: Self-pay | Admitting: Physician Assistant

## 2020-08-06 VITALS — BP 118/64 | HR 72 | Temp 98.4°F | Resp 16 | Ht 72.0 in | Wt 248.2 lb

## 2020-08-06 DIAGNOSIS — G40909 Epilepsy, unspecified, not intractable, without status epilepticus: Secondary | ICD-10-CM

## 2020-08-06 DIAGNOSIS — Z1329 Encounter for screening for other suspected endocrine disorder: Secondary | ICD-10-CM | POA: Diagnosis not present

## 2020-08-06 DIAGNOSIS — Z8616 Personal history of COVID-19: Secondary | ICD-10-CM

## 2020-08-06 DIAGNOSIS — Z130 Encounter for screening for diseases of the blood and blood-forming organs and certain disorders involving the immune mechanism: Secondary | ICD-10-CM

## 2020-08-06 DIAGNOSIS — E785 Hyperlipidemia, unspecified: Secondary | ICD-10-CM

## 2020-08-06 NOTE — Patient Instructions (Signed)
COVID-19 Quarantine vs. Isolation QUARANTINE keeps someone who was in close contact with someone who has COVID-19 away from others. Quarantine if you have been in close contact with someone who has COVID-19, unless you have been fully vaccinated. If you are fully vaccinated  You do NOT need to quarantine unless they have symptoms  Get tested 3-5 days after your exposure, even if you don't have symptoms  Wear a mask indoors in public for 14 days following exposure or until your test result is negative If you are not fully vaccinated  Stay home for 14 days after your last contact with a person who has COVID-19  Watch for fever (100.4F), cough, shortness of breath, or other symptoms of COVID-19  If possible, stay away from people you live with, especially people who are at higher risk for getting very sick from COVID-19  Contact your local public health department for options in your area to possibly shorten your quarantine ISOLATION keeps someone who is sick or tested positive for COVID-19 without symptoms away from others, even in their own home. People who are in isolation should stay home and stay in a specific "sick room" or area and use a separate bathroom (if available). If you are sick and think or know you have COVID-19 Stay home until after  At least 10 days since symptoms first appeared and  At least 24 hours with no fever without the use of fever-reducing medications and  Symptoms have improved If you tested positive for COVID-19 but do not have symptoms  Stay home until after 10 days have passed since your positive viral test  If you develop symptoms after testing positive, follow the steps above for those who are sick cdc.gov/coronavirus 02/09/2020 This information is not intended to replace advice given to you by your health care provider. Make sure you discuss any questions you have with your health care provider. Document Revised: 03/15/2020 Document Reviewed:  03/15/2020 Elsevier Patient Education  2021 Elsevier Inc.  

## 2020-08-06 NOTE — Progress Notes (Signed)
Established patient visit   Patient: Pedro [name]   DOB: 1980/09/13   40 y.o. Male  MRN: [id number] Visit Date: 08/06/2020  Today's healthcare provider: Trey Sailors, PA-C   Chief Complaint  Patient presents with  . side effects    Of covid in feb. Dizziness, headache, nausea, light sensitivity   Subjective    HPI HPI    side effects    Comments: Of covid in feb. Dizziness, headache, nausea, light sensitivity       Last edited by Marcos Eke, CMA on 08/06/2020  3:10 PM. (History)      Lipid/Cholesterol, Follow-up  Last lipid panel Other pertinent labs  Lab Results  Component Value Date   CHOL 220 (H) 08/06/2020   HDL 74 08/06/2020   LDLCALC 128 (H) 08/06/2020   TRIG 79 08/06/2020   CHOLHDL 3.0 08/06/2020   Lab Results  Component Value Date   ALT 18 08/06/2020   AST 16 08/06/2020   PLT 218 08/06/2020   TSH 2.29 08/06/2020     He was last seen for this 2 months ago.  Management since that visit includes monitor.  He reports excellent compliance with treatment. He is not having side effects.   Symptoms: No chest pain No chest pressure/discomfort  No dyspnea No lower extremity edema  No numbness or tingling of extremity No orthopnea  No palpitations No paroxysmal nocturnal dyspnea  No speech difficulty No syncope   Current diet: well balanced Current exercise: aerobics  The 10-year ASCVD risk score Denman George DC Jr., et al., 2013) is: 2.8%  ---------------------------------------------------------------------------------------------------      Medications: Outpatient Medications Prior to Visit  Medication Sig  . CARBATROL 200 MG 12 hr capsule Take 2 capsules in the morning and 3 capsules in the evening  . DEPAKOTE ER 500 MG 24 hr tablet TAKE 2 TABLETS (1,000 MG TOTAL) BY MOUTH AT BEDTIME.  . meloxicam (MOBIC) 15 MG tablet Take 1 tablet (15 mg total) by mouth daily as needed for pain.  . Pediatric Multiple Vitamins (FLINTSTONES  MULTIVITAMIN PO) Take by mouth.   No facility-administered medications prior to visit.    Review of Systems  All other systems reviewed and are negative.      Objective    BP 118/64   Pulse 72   Temp 98.4 F (36.9 C)   Resp 16   Ht 6' (1.829 m)   Wt 248 lb 3.2 oz (112.6 kg)   SpO2 99%   BMI 33.66 kg/m     Physical Exam Constitutional:      Appearance: Normal appearance.  Cardiovascular:     Rate and Rhythm: Normal rate and regular rhythm.     Heart sounds: Normal heart sounds.  Pulmonary:     Effort: Pulmonary effort is normal.     Breath sounds: Normal breath sounds.  Skin:    General: Skin is warm and dry.  Neurological:     Mental Status: He is alert and oriented to person, place, and time. Mental status is at baseline.  Psychiatric:        Mood and Affect: Mood normal.        Behavior: Behavior normal.       Results for orders placed or performed in visit on 08/06/20  TSH  Result Value Ref Range   TSH 2.29 0.40 - 4.50 mIU/L  Lipid panel  Result Value Ref Range   Cholesterol 220 (H) <200 mg/dL   HDL 74 >  OR = 40 mg/dL   Triglycerides 79 <250 mg/dL   LDL Cholesterol (Calc) 128 (H) mg/dL (calc)   Total CHOL/HDL Ratio 3.0 <5.0 (calc)   Non-HDL Cholesterol (Calc) 146 (H) <130 mg/dL (calc)  Comprehensive metabolic panel  Result Value Ref Range   Glucose, Bld 78 65 - 99 mg/dL   BUN 17 7 - 25 mg/dL   Creat 5.39 7.67 - 3.41 mg/dL   BUN/Creatinine Ratio NOT APPLICABLE 6 - 22 (calc)   Sodium 141 135 - 146 mmol/L   Potassium 4.1 3.5 - 5.3 mmol/L   Chloride 105 98 - 110 mmol/L   CO2 25 20 - 32 mmol/L   Calcium 9.4 8.6 - 10.3 mg/dL   Total Protein 7.3 6.1 - 8.1 g/dL   Albumin 4.4 3.6 - 5.1 g/dL   Globulin 2.9 1.9 - 3.7 g/dL (calc)   AG Ratio 1.5 1.0 - 2.5 (calc)   Total Bilirubin 0.4 0.2 - 1.2 mg/dL   Alkaline phosphatase (APISO) 62 36 - 130 U/L   AST 16 10 - 40 U/L   ALT 18 9 - 46 U/L  CBC with Differential/Platelet  Result Value Ref Range   WBC 5.6  3.8 - 10.8 Thousand/uL   RBC 5.21 4.20 - 5.80 Million/uL   Hemoglobin 15.0 13.2 - 17.1 g/dL   HCT 93.7 90.2 - 40.9 %   MCV 86.0 80.0 - 100.0 fL   MCH 28.8 27.0 - 33.0 pg   MCHC 33.5 32.0 - 36.0 g/dL   RDW 73.5 32.9 - 92.4 %   Platelets 218 140 - 400 Thousand/uL   MPV 9.7 7.5 - 12.5 fL   Neutro Abs 2,806 1,500 - 7,800 cells/uL   Lymphs Abs 2,100 850 - 3,900 cells/uL   Absolute Monocytes 644 200 - 950 cells/uL   Eosinophils Absolute 28 15 - 500 cells/uL   Basophils Absolute 22 0 - 200 cells/uL   Neutrophils Relative % 50.1 %   Total Lymphocyte 37.5 %   Monocytes Relative 11.5 %   Eosinophils Relative 0.5 %   Basophils Relative 0.4 %    Assessment & Plan    1. History of COVID-19  Doing better.   2. Nonintractable epilepsy without status epilepticus, unspecified epilepsy type (HCC)  Followed by [facility].   3. Hyperlipidemia, unspecified hyperlipidemia type  Monitor.   - Lipid panel - Comprehensive metabolic panel  4. Thyroid disorder screening  - TSH  5. Screening for deficiency anemia  - CBC with Differential/Platelet   Return if symptoms worsen or fail to improve.      I spent 20 minutes dedicated to the care of this patient on the date of this encounter to include pre-visit review of records, face-to-face time with the patient discussing HLD, and post visit ordering of testing.    Trey Sailors, PA-C  [facility] [phone] (phone) [phone] (fax)  [facility]

## 2020-08-07 LAB — LIPID PANEL
Cholesterol: 220 mg/dL — ABNORMAL HIGH (ref <200)
HDL: 74 mg/dL (ref >=40)
LDL Cholesterol (Calc): 128 mg/dL (calc) — ABNORMAL HIGH
Non-HDL Cholesterol (Calc): 146 mg/dL (calc) — ABNORMAL HIGH (ref <130)
Total CHOL/HDL Ratio: 3 (calc) (ref <5.0)
Triglycerides: 79 mg/dL (ref <150)

## 2020-08-07 LAB — COMPREHENSIVE METABOLIC PANEL
AG Ratio: 1.5 (calc) (ref 1.0–2.5)
ALT: 18 U/L (ref 9–46)
AST: 16 U/L (ref 10–40)
Albumin: 4.4 g/dL (ref 3.6–5.1)
Alkaline phosphatase (APISO): 62 U/L (ref 36–130)
BUN: 17 mg/dL (ref 7–25)
CO2: 25 mmol/L (ref 20–32)
Calcium: 9.4 mg/dL (ref 8.6–10.3)
Chloride: 105 mmol/L (ref 98–110)
Creat: 0.83 mg/dL (ref 0.60–1.35)
Globulin: 2.9 g/dL (calc) (ref 1.9–3.7)
Glucose, Bld: 78 mg/dL (ref 65–99)
Potassium: 4.1 mmol/L (ref 3.5–5.3)
Sodium: 141 mmol/L (ref 135–146)
Total Bilirubin: 0.4 mg/dL (ref 0.2–1.2)
Total Protein: 7.3 g/dL (ref 6.1–8.1)

## 2020-08-07 LAB — CBC WITH DIFFERENTIAL/PLATELET
Absolute Monocytes: 644 cells/uL (ref 200–950)
Basophils Absolute: 22 cells/uL (ref 0–200)
Basophils Relative: 0.4 %
Eosinophils Absolute: 28 cells/uL (ref 15–500)
Eosinophils Relative: 0.5 %
HCT: 44.8 % (ref 38.5–50.0)
Hemoglobin: 15 g/dL (ref 13.2–17.1)
Lymphs Abs: 2100 cells/uL (ref 850–3900)
MCH: 28.8 pg (ref 27.0–33.0)
MCHC: 33.5 g/dL (ref 32.0–36.0)
MCV: 86 fL (ref 80.0–100.0)
MPV: 9.7 fL (ref 7.5–12.5)
Monocytes Relative: 11.5 %
Neutro Abs: 2806 cells/uL (ref 1500–7800)
Neutrophils Relative %: 50.1 %
Platelets: 218 10*3/uL (ref 140–400)
RBC: 5.21 10*6/uL (ref 4.20–5.80)
RDW: 13.1 % (ref 11.0–15.0)
Total Lymphocyte: 37.5 %
WBC: 5.6 10*3/uL (ref 3.8–10.8)

## 2020-08-07 LAB — TSH: TSH: 2.29 mIU/L (ref 0.40–4.50)

## 2020-08-11 ENCOUNTER — Encounter: Payer: Self-pay | Admitting: Emergency Medicine

## 2020-08-12 ENCOUNTER — Telehealth: Payer: Self-pay

## 2020-08-12 NOTE — Telephone Encounter (Signed)
Copied from CRM [id number]. Topic: General - Other >> Aug 12, 2020  2:13 PM Pawlus, Maxine Glenn A wrote: Reason for CRM: Pt wanted to go over his labs results, please call back.

## 2020-08-12 NOTE — Telephone Encounter (Signed)
Labs reviewed.

## 2021-04-18 ENCOUNTER — Ambulatory Visit: Payer: Managed Care, Other (non HMO) | Admitting: Physician Assistant

## 2021-04-18 ENCOUNTER — Encounter: Payer: Self-pay | Admitting: Physician Assistant

## 2021-04-18 ENCOUNTER — Other Ambulatory Visit: Payer: Self-pay

## 2021-04-18 VITALS — BP 128/64 | HR 74 | Temp 97.7°F | Resp 18 | Ht 70.0 in | Wt 243.9 lb

## 2021-04-18 DIAGNOSIS — Z5181 Encounter for therapeutic drug level monitoring: Secondary | ICD-10-CM | POA: Diagnosis not present

## 2021-04-18 DIAGNOSIS — R42 Dizziness and giddiness: Secondary | ICD-10-CM | POA: Diagnosis not present

## 2021-04-18 DIAGNOSIS — J069 Acute upper respiratory infection, unspecified: Secondary | ICD-10-CM | POA: Diagnosis not present

## 2021-04-18 NOTE — Patient Instructions (Addendum)
Use saline rinses to help with nasal dryness Use tylenol and ibuprofen to help with headaches and sinus pain Use eyeglasses to prevent strain or squinting Stay hydrated and reduce speed of movements to help with dizziness.  May use OTC medications such as Sudafed, Dayquil, Nyquil to help with decongestion and dizziness.  May use humidifier at night to help with nasal dryness as well Follow up in 7-10 days if symptoms worsen or are not improving.

## 2021-04-18 NOTE — Assessment & Plan Note (Signed)
Acute intermittent viral URI symptoms comprised of nasal dryness, fatigue, headache and dizziness with Negative COVID test on Friday. Negative for fever, sore throat, body aches.  Use nasal rinse to assist with nasal dryness as well as humidifier at night.  Can use OTC medications to assist with headaches (tylenol and ibuprofen).  Recheck Depakote and Carbatrol levels to rule out cause of headaches and dizziness  Encouraged use of eyeglasses to prevent straining and squinting.  Follow up if symptoms do not improve or worsen over next 7-10 days.

## 2021-04-18 NOTE — Progress Notes (Signed)
Acute Office Visit  Subjective:    Patient ID: Pedro [name], male    DOB: 01/05/1981, 40 y.o.   MRN: [id number]  Chief Complaint  Patient presents with   Sinusitis   Headache    Loss of taste    Sinusitis Associated symptoms include headaches (reports pain over eyes and feeling congested.) and sinus pressure. Pertinent negatives include no chills, congestion, coughing, shortness of breath or sore throat.  Headache  Associated symptoms include dizziness, eye pain, nausea, photophobia and sinus pressure. Pertinent negatives include no coughing, fever, rhinorrhea, sore throat or vomiting.  Patient is in today for sinus congestion  Symptoms have been intermittent since Monday Nov 28th Symptoms include fatigue, headaches,dizziness, photosensitivity, phonophobia, recent changes to sense of smell. States he has had two negative COVID tests. Last one was Friday Dec 2nd.  States he is not treating symptoms regularly with OTC measures.   Denies hx of migraines, States recent weight loss was planned due to calorie monitoring.   Eye pain with associated photophobia - has not been using glasses for the last three years. Has been squinting to do daily tasks.     Past Medical History:  Diagnosis Date   Arthritis    Seizures (HCC)     Past Surgical History:  Procedure Laterality Date   WISDOM TOOTH EXTRACTION  04/21/2016    Family History  Problem Relation Age of Onset   Seizures Mother    Hypertension Mother    Hyperlipidemia Father    Hypertension Father    Hypertension Sister    Depression Brother     Social History   Socioeconomic History   Marital status: Single    Spouse name: Not on file   Number of children: 0   Years of education: Not on file   Highest education level: Some college, no degree  Occupational History   Not on file  Tobacco Use   Smoking status: Former    Years: 5.00    Types: Cigarettes    Start date: 05/16/1995    Quit date: 05/16/1999     Years since quitting: 21.9   Smokeless tobacco: Never  Vaping Use   Vaping Use: Never used  Substance and Sexual Activity   Alcohol use: Yes    Alcohol/week: 2.0 - 3.0 standard drinks    Types: 2 - 3 Cans of beer per week    Comment:  maybe 2-3 cans of beer a day   Drug use: No   Sexual activity: Not Currently    Partners: Female  Other Topics Concern   Not on file  Social History Narrative   Not on file   Social Determinants of Health   Financial Resource Strain: Not on file  Food Insecurity: Not on file  Transportation Needs: Not on file  Physical Activity: Not on file  Stress: Not on file  Social Connections: Not on file  Intimate Partner Violence: Not on file    Outpatient Medications Prior to Visit  Medication Sig Dispense Refill   CARBATROL 200 MG 12 hr capsule Take 2 capsules in the morning and 3 capsules in the evening 450 capsule 1   DEPAKOTE ER 500 MG 24 hr tablet TAKE 2 TABLETS (1,000 MG TOTAL) BY MOUTH AT BEDTIME. 180 tablet 3   divalproex (DEPAKOTE ER) 500 MG 24 hr tablet Take 2 tablets by mouth at bedtime.     Pediatric Multiple Vitamins (FLINTSTONES MULTIVITAMIN PO) Take by mouth.     meloxicam (MOBIC) 15  MG tablet Take 1 tablet (15 mg total) by mouth daily as needed for pain. (Patient not taking: Reported on 04/18/2021) 30 tablet 1   No facility-administered medications prior to visit.    Allergies  Allergen Reactions   Caffeine    Nicotine     Review of Systems  Constitutional:  Positive for fatigue. Negative for chills, fever and unexpected weight change.  HENT:  Positive for sinus pressure and sinus pain. Negative for congestion, rhinorrhea, sore throat and trouble swallowing.   Eyes:  Positive for photophobia and pain.  Respiratory:  Negative for cough, chest tightness, shortness of breath and wheezing.   Cardiovascular:  Negative for chest pain.  Gastrointestinal:  Positive for nausea. Negative for constipation, diarrhea and vomiting.   Genitourinary:  Negative for difficulty urinating and dysuria.  Neurological:  Positive for dizziness and headaches (reports pain over eyes and feeling congested.).      Objective:    Physical Exam Constitutional:      Appearance: He is well-developed.  HENT:     Head: Normocephalic.     Right Ear: Hearing, tympanic membrane and ear canal normal.     Left Ear: Hearing, tympanic membrane and ear canal normal.     Nose:     Right Turbinates: Not enlarged.     Left Turbinates: Enlarged and swollen.     Mouth/Throat:     Mouth: Mucous membranes are moist.     Pharynx: Oropharynx is clear. Uvula midline. No pharyngeal swelling, oropharyngeal exudate or posterior oropharyngeal erythema.  Eyes:     General: Lids are normal.        Right eye: No discharge.        Left eye: No discharge.     Conjunctiva/sclera:     Right eye: Right conjunctiva is not injected.     Left eye: Left conjunctiva is not injected.     Pupils: Pupils are equal, round, and reactive to light.  Cardiovascular:     Rate and Rhythm: Normal rate and regular rhythm.     Pulses: Normal pulses.     Heart sounds: Normal heart sounds.  Pulmonary:     Effort: Pulmonary effort is normal.     Breath sounds: Normal breath sounds. No decreased breath sounds.  Abdominal:     General: Abdomen is flat. Bowel sounds are normal.     Palpations: Abdomen is soft.  Musculoskeletal:     Right lower leg: No edema.     Left lower leg: No edema.  Lymphadenopathy:     Head:     Right side of head: No submental or submandibular adenopathy.     Left side of head: No submental or submandibular adenopathy.     Upper Body:     Right upper body: No supraclavicular adenopathy.     Left upper body: No supraclavicular adenopathy.  Neurological:     Mental Status: He is alert.    BP 128/64 (BP Location: Right Arm, Patient Position: Sitting, Cuff Size: Normal)   Pulse 74   Temp 97.7 F (36.5 C) (Oral)   Resp 18   Ht 5\' 10"  (1.778  m) Comment: per patient  Wt 243 lb 14.4 oz (110.6 kg)   SpO2 97%   BMI 35.00 kg/m  Wt Readings from Last 3 Encounters:  04/18/21 243 lb 14.4 oz (110.6 kg)  08/06/20 248 lb 3.2 oz (112.6 kg)  05/25/20 254 lb 3.2 oz (115.3 kg)    Health Maintenance Due  Topic Date  Due   Pneumococcal Vaccine 44-78 Years old (1 - PCV) Never done   COVID-19 Vaccine (3 - Pfizer risk series) 08/01/2019   INFLUENZA VACCINE  Never done    There are no preventive care reminders to display for this patient.   Lab Results  Component Value Date   TSH 2.29 08/06/2020   Lab Results  Component Value Date   WBC 5.6 08/06/2020   HGB 15.0 08/06/2020   HCT 44.8 08/06/2020   MCV 86.0 08/06/2020   PLT 218 08/06/2020   Lab Results  Component Value Date   NA 141 08/06/2020   K 4.1 08/06/2020   CO2 25 08/06/2020   GLUCOSE 78 08/06/2020   BUN 17 08/06/2020   CREATININE 0.83 08/06/2020   BILITOT 0.4 08/06/2020   ALKPHOS 87 07/31/2016   AST 16 08/06/2020   ALT 18 08/06/2020   PROT 7.3 08/06/2020   ALBUMIN 4.6 07/31/2016   CALCIUM 9.4 08/06/2020   ANIONGAP 8 01/25/2015   Lab Results  Component Value Date   CHOL 220 (H) 08/06/2020   Lab Results  Component Value Date   HDL 74 08/06/2020   Lab Results  Component Value Date   LDLCALC 128 (H) 08/06/2020   Lab Results  Component Value Date   TRIG 79 08/06/2020   Lab Results  Component Value Date   CHOLHDL 3.0 08/06/2020   No results found for: HGBA1C     Assessment & Plan:    Problem List Items Addressed This Visit       Respiratory   Upper respiratory infection, acute - Primary    Acute intermittent viral URI symptoms comprised of nasal dryness, fatigue, headache and dizziness with Negative COVID test on Friday. Negative for fever, sore throat, body aches.  Use nasal rinse to assist with nasal dryness as well as humidifier at night.  Can use OTC medications to assist with headaches (tylenol and ibuprofen).  Recheck Depakote and  Carbatrol levels to rule out cause of headaches and dizziness  Encouraged use of eyeglasses to prevent straining and squinting.  Follow up if symptoms do not improve or worsen over next 7-10 days.         Other Visit Diagnoses     Dizziness       Relevant Orders   Valproic Acid level   Carbamazepine Level (Tegretol), total   Medication monitoring encounter       Relevant Orders   Valproic Acid level   Carbamazepine Level (Tegretol), total        No follow-ups on file.   I, Issaih Kaus E Celester Morgan, PA-C, have reviewed all documentation for this visit. The documentation on 04/18/21 for the exam, diagnosis, procedures, and orders are all accurate and complete.   Ninoska Goswick, Mirian Mo MPH [facility]    No orders of the defined types were placed in this encounter.    Jessel Gettinger E Rufus Beske, PA-C

## 2021-04-19 LAB — VALPROIC ACID LEVEL: Valproic Acid Lvl: 64.8 mg/L (ref 50.0–100.0)

## 2021-04-19 LAB — CARBAMAZEPINE LEVEL, TOTAL: Carbamazepine Lvl: 10 mg/L (ref 4.0–12.0)

## 2021-06-13 ENCOUNTER — Encounter: Payer: Self-pay | Admitting: Family Medicine

## 2021-06-13 ENCOUNTER — Other Ambulatory Visit: Payer: Self-pay

## 2021-06-13 ENCOUNTER — Ambulatory Visit: Payer: Managed Care, Other (non HMO) | Admitting: Family Medicine

## 2021-06-13 VITALS — BP 120/62 | HR 68 | Ht 70.0 in | Wt 240.0 lb

## 2021-06-13 DIAGNOSIS — Z7689 Persons encountering health services in other specified circumstances: Secondary | ICD-10-CM

## 2021-06-13 NOTE — Progress Notes (Signed)
Date:  06/13/2021   Name:  Pedro [name]   DOB:  1980-06-29   MRN:  [id number]   Chief Complaint: Establish Care  Patient is a 41 year old male who presents for a establish care exam. The patient reports the following problems:seizure disorder . Health maintenance has been reviewed up to date.     Lab Results  Component Value Date   NA 141 08/06/2020   K 4.1 08/06/2020   CO2 25 08/06/2020   GLUCOSE 78 08/06/2020   BUN 17 08/06/2020   CREATININE 0.83 08/06/2020   CALCIUM 9.4 08/06/2020   GFRNONAA 104 12/04/2019   Lab Results  Component Value Date   CHOL 220 (H) 08/06/2020   HDL 74 08/06/2020   LDLCALC 128 (H) 08/06/2020   TRIG 79 08/06/2020   CHOLHDL 3.0 08/06/2020   Lab Results  Component Value Date   TSH 2.29 08/06/2020   No results found for: HGBA1C Lab Results  Component Value Date   WBC 5.6 08/06/2020   HGB 15.0 08/06/2020   HCT 44.8 08/06/2020   MCV 86.0 08/06/2020   PLT 218 08/06/2020   Lab Results  Component Value Date   ALT 18 08/06/2020   AST 16 08/06/2020   ALKPHOS 87 07/31/2016   BILITOT 0.4 08/06/2020   Lab Results  Component Value Date   VD25OH 24 (L) 04/30/2018     Review of Systems  Constitutional:  Negative for chills and fever.  HENT:  Negative for drooling, ear discharge, ear pain, rhinorrhea, sinus pressure, sore throat and tinnitus.   Respiratory:  Negative for cough, shortness of breath and wheezing.   Cardiovascular:  Negative for chest pain, palpitations and leg swelling.  Gastrointestinal:  Negative for abdominal pain, blood in stool, constipation, diarrhea and nausea.  Endocrine: Negative for polydipsia and polyuria.  Genitourinary:  Negative for dysuria, frequency, hematuria and urgency.  Musculoskeletal:  Negative for back pain, myalgias and neck pain.  Skin:  Negative for rash.  Allergic/Immunologic: Negative for environmental allergies.  Neurological:  Positive for tremors, seizures and weakness. Negative for  dizziness and headaches.  Hematological:  Does not bruise/bleed easily.  Psychiatric/Behavioral:  Negative for suicidal ideas. The patient is not nervous/anxious.    Patient Active Problem List   Diagnosis Date Noted   Upper respiratory infection, acute 04/18/2021   Nonintractable generalized idiopathic epilepsy without status epilepticus (HCC) 05/25/2020   Seizure-like activity (HCC) 01/26/2020   Seizure (HCC) 01/14/2020   Allergic conjunctivitis 09/20/2017   Obesity (BMI 35.0-39.9 without comorbidity) 02/21/2017   Hyperlipidemia 07/31/2016   Admission for therapeutic drug monitoring 07/31/2016   Seborrheic keratosis 06/08/2016   Screening examination for STD (sexually transmitted disease) 06/08/2016   Vitamin D deficiency 02/01/2016   Encounter for medication monitoring 02/01/2016   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   Bulge of lumbar disc without myelopathy 01/26/2015   Epilepsy (HCC) 03/17/2013    Allergies  Allergen Reactions   Caffeine    Nicotine     Past Surgical History:  Procedure Laterality Date   WISDOM TOOTH EXTRACTION  04/21/2016    Social History   Tobacco Use   Smoking status: Former    Years: 5.00    Types: Cigarettes    Start date: 05/16/1995    Quit date: 05/16/1999    Years since quitting: 22.0   Smokeless tobacco: Never  Vaping Use   Vaping Use: Never used  Substance Use Topics   Alcohol use: Not Currently    Alcohol/week: 2.0 - 3.0  standard drinks    Types: 2 - 3 Cans of beer per week   Drug use: No     Medication list has been reviewed and updated.  Current Meds  Medication Sig   CARBATROL 200 MG 12 hr capsule Take 2 capsules in the morning and 3 capsules in the evening   DEPAKOTE ER 500 MG 24 hr tablet TAKE 2 TABLETS (1,000 MG TOTAL) BY MOUTH AT BEDTIME.   divalproex (DEPAKOTE ER) 500 MG 24 hr tablet Take 2 tablets by mouth at bedtime.   Pediatric Multiple Vitamins (FLINTSTONES MULTIVITAMIN PO) Take by mouth.    PHQ 2/9 Scores 06/13/2021  04/18/2021 08/06/2020 05/25/2020  PHQ - 2 Score 0 0 0 0  PHQ- 9 Score 0 - 0 -    GAD 7 : Generalized Anxiety Score 06/13/2021 09/16/2018  Nervous, Anxious, on Edge 0 0  Control/stop worrying 0 0  Worry too much - different things 0 0  Trouble relaxing 0 0  Restless 0 0  Easily annoyed or irritable 0 0  Afraid - awful might happen 0 0  Total GAD 7 Score 0 0  Anxiety Difficulty Not difficult at all Not difficult at all    BP Readings from Last 3 Encounters:  06/13/21 120/62  04/18/21 128/64  08/06/20 118/64    Physical Exam Vitals and nursing note reviewed.  HENT:     Head: Normocephalic.     Right Ear: Tympanic membrane, ear canal and external ear normal.     Left Ear: Tympanic membrane, ear canal and external ear normal.     Nose: Nose normal. No congestion or rhinorrhea.  Eyes:     General: No scleral icterus.       Right eye: No discharge.        Left eye: No discharge.     Conjunctiva/sclera: Conjunctivae normal.     Pupils: Pupils are equal, round, and reactive to light.  Neck:     Thyroid: No thyromegaly.     Vascular: No JVD.     Trachea: No tracheal deviation.  Cardiovascular:     Rate and Rhythm: Normal rate and regular rhythm.     Heart sounds: Normal heart sounds. No murmur heard.   No friction rub. No gallop.  Pulmonary:     Effort: No respiratory distress.     Breath sounds: Normal breath sounds. No stridor. No wheezing, rhonchi or rales.  Chest:     Chest wall: No tenderness.  Abdominal:     General: Bowel sounds are normal.     Palpations: Abdomen is soft. There is no mass.     Tenderness: There is no abdominal tenderness. There is no right CVA tenderness, left CVA tenderness, guarding or rebound.  Musculoskeletal:        General: No tenderness. Normal range of motion.     Cervical back: Normal range of motion and neck supple.  Lymphadenopathy:     Cervical: No cervical adenopathy.  Skin:    General: Skin is warm.     Findings: No bruising, erythema  or rash.  Neurological:     Mental Status: He is alert and oriented to person, place, and time.     Cranial Nerves: No cranial nerve deficit.     Deep Tendon Reflexes: Reflexes are normal and symmetric.    Wt Readings from Last 3 Encounters:  06/13/21 240 lb (108.9 kg)  04/18/21 243 lb 14.4 oz (110.6 kg)  08/06/20 248 lb 3.2 oz (112.6 kg)  BP 120/62    Pulse 68    Ht 5\' 10"  (1.778 m)    Wt 240 lb (108.9 kg)    BMI 34.44 kg/m   Assessment and Plan:  1. Establishing care with new doctor, encounter for Patient establishing care with new physician.  Patient will continue with seizure maintenance with neurology Dr. Melrose Nakayama.  There is no specific concerns at this time and patient will return on a as needed basis and if not sooner within the year for reevaluation.

## 2021-09-13 ENCOUNTER — Ambulatory Visit
Admission: RE | Admit: 2021-09-13 | Discharge: 2021-09-13 | Disposition: A | Discharge status: Home / Self Care | Payer: Managed Care, Other (non HMO) | Source: Ambulatory Visit | Attending: Family Medicine | Admitting: Family Medicine

## 2021-09-13 ENCOUNTER — Encounter: Payer: Self-pay | Admitting: Family Medicine

## 2021-09-13 ENCOUNTER — Ambulatory Visit: Payer: Managed Care, Other (non HMO) | Admitting: Family Medicine

## 2021-09-13 ENCOUNTER — Ambulatory Visit
Admission: RE | Admit: 2021-09-13 | Discharge: 2021-09-13 | Disposition: A | Discharge status: Home / Self Care | Payer: Managed Care, Other (non HMO) | Attending: Family Medicine | Admitting: Family Medicine

## 2021-09-13 VITALS — BP 120/64 | HR 68 | Ht 70.0 in | Wt 255.0 lb

## 2021-09-13 DIAGNOSIS — M7712 Lateral epicondylitis, left elbow: Secondary | ICD-10-CM

## 2021-09-13 MED ORDER — MELOXICAM 15 MG PO TABS: 15.0000 mg | ORAL_TABLET | Freq: Every day | ORAL | 0 refills | Status: DC

## 2021-09-13 NOTE — Progress Notes (Signed)
? ? ?Date:  09/13/2021  ? ?Name:  Pedro [name]   DOB:  Apr 03, 1981   MRN:  [id number] ? ? ?Chief Complaint: Arm Pain (Hurting specifically around elbow after washing/ waxing cars and folding clothes. ) ? ?Arm Pain  ?The incident occurred more than 1 week ago. The pain is present in the left elbow. The quality of the pain is described as aching. The pain radiates to the left arm. The pain is moderate. The pain has been Constant since the incident. Pertinent negatives include no chest pain, numbness or tingling. He has tried NSAIDs for the symptoms. The treatment provided mild relief.  ? ?Lab Results  ?Component Value Date  ? NA 141 08/06/2020  ? K 4.1 08/06/2020  ? CO2 25 08/06/2020  ? GLUCOSE 78 08/06/2020  ? BUN 17 08/06/2020  ? CREATININE 0.83 08/06/2020  ? CALCIUM 9.4 08/06/2020  ? GFRNONAA 104 12/04/2019  ? ?Lab Results  ?Component Value Date  ? CHOL 220 (H) 08/06/2020  ? HDL 74 08/06/2020  ? LDLCALC 128 (H) 08/06/2020  ? TRIG 79 08/06/2020  ? CHOLHDL 3.0 08/06/2020  ? ?Lab Results  ?Component Value Date  ? TSH 2.29 08/06/2020  ? ?No results found for: HGBA1C ?Lab Results  ?Component Value Date  ? WBC 5.6 08/06/2020  ? HGB 15.0 08/06/2020  ? HCT 44.8 08/06/2020  ? MCV 86.0 08/06/2020  ? PLT 218 08/06/2020  ? ?Lab Results  ?Component Value Date  ? ALT 18 08/06/2020  ? AST 16 08/06/2020  ? ALKPHOS 87 07/31/2016  ? BILITOT 0.4 08/06/2020  ? ?Lab Results  ?Component Value Date  ? VD25OH 24 (L) 04/30/2018  ?  ? ?Review of Systems  ?Constitutional:  Negative for chills and fever.  ?HENT:  Negative for drooling, ear discharge, ear pain and sore throat.   ?Respiratory:  Negative for cough, shortness of breath and wheezing.   ?Cardiovascular:  Negative for chest pain, palpitations and leg swelling.  ?Gastrointestinal:  Negative for abdominal pain, blood in stool, constipation, diarrhea and nausea.  ?Endocrine: Negative for polydipsia.  ?Genitourinary:  Negative for dysuria, frequency, hematuria and urgency.   ?Musculoskeletal:  Negative for back pain, myalgias and neck pain.  ?Skin:  Negative for rash.  ?Allergic/Immunologic: Negative for environmental allergies.  ?Neurological:  Negative for dizziness, tingling, numbness and headaches.  ?Hematological:  Does not bruise/bleed easily.  ?Psychiatric/Behavioral:  Negative for suicidal ideas. The patient is not nervous/anxious.   ? ?Patient Active Problem List  ? Diagnosis Date Noted  ? Upper respiratory infection, acute 04/18/2021  ? Nonintractable generalized idiopathic epilepsy without status epilepticus ([place]) 05/25/2020  ? Seizure-like activity ([place]) 01/26/2020  ? Seizure ([place]) 01/14/2020  ? Allergic conjunctivitis 09/20/2017  ? Obesity (BMI 35.0-39.9 without comorbidity) 02/21/2017  ? Hyperlipidemia 07/31/2016  ? Admission for therapeutic drug monitoring 07/31/2016  ? Seborrheic keratosis 06/08/2016  ? Screening examination for STD (sexually transmitted disease) 06/08/2016  ? Vitamin D deficiency 02/01/2016  ? Encounter for medication monitoring 02/01/2016  ? 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  ? Bulge of lumbar disc without myelopathy 01/26/2015  ? Epilepsy ([place]) 03/17/2013  ? ? ?Allergies  ?Allergen Reactions  ? Caffeine   ? Nicotine   ? ? ?Past Surgical History:  ?Procedure Laterality Date  ? WISDOM TOOTH EXTRACTION  04/21/2016  ? ? ?Social History  ? ?Tobacco Use  ? Smoking status: Former  ?  Years: 5.00  ?  Types: Cigarettes  ?  Start date: 05/16/1995  ?  Quit date: 05/16/1999  ?  Years since quitting: 22.3  ? Smokeless tobacco: Never  ?Vaping Use  ? Vaping Use: Never used  ?Substance Use Topics  ? Alcohol use: Not Currently  ?  Alcohol/week: 2.0 - 3.0 standard drinks  ?  Types: 2 - 3 Cans of beer per week  ? Drug use: No  ? ? ? ?Medication list has been reviewed and updated. ? ?Current Meds  ?Medication Sig  ? CARBATROL 200 MG 12 hr capsule Take 2 capsules in the morning and 3 capsules in the evening  ? DEPAKOTE ER 500 MG 24 hr tablet TAKE 2 TABLETS (1,000 MG TOTAL) BY  MOUTH AT BEDTIME.  ? divalproex (DEPAKOTE ER) 500 MG 24 hr tablet Take 2 tablets by mouth at bedtime.  ? [DISCONTINUED] Pediatric Multiple Vitamins (FLINTSTONES MULTIVITAMIN PO) Take by mouth.  ? ? ? ?  09/13/2021  ? 10:08 AM 06/13/2021  ?  2:20 PM 09/16/2018  ? 10:11 AM  ?GAD 7 : Generalized Anxiety Score  ?Nervous, Anxious, on Edge 0 0 0  ?Control/stop worrying 0 0 0  ?Worry too much - different things 0 0 0  ?Trouble relaxing 0 0 0  ?Restless 0 0 0  ?Easily annoyed or irritable 0 0 0  ?Afraid - awful might happen 0 0 0  ?Total GAD 7 Score 0 0 0  ?Anxiety Difficulty Not difficult at all Not difficult at all Not difficult at all  ? ? ? ?  09/13/2021  ? 10:07 AM  ?Depression screen PHQ 2/9  ?Decreased Interest 0  ?Down, Depressed, Hopeless 0  ?PHQ - 2 Score 0  ?Altered sleeping 0  ?Tired, decreased energy 0  ?Change in appetite 0  ?Feeling bad or failure about yourself  0  ?Trouble concentrating 0  ?Moving slowly or fidgety/restless 0  ?Suicidal thoughts 0  ?PHQ-9 Score 0  ?Difficult doing work/chores Not difficult at all  ? ? ?BP Readings from Last 3 Encounters:  ?09/13/21 120/64  ?06/13/21 120/62  ?04/18/21 128/64  ? ? ?Physical Exam ?Vitals reviewed.  ?HENT:  ?   Head: Normocephalic.  ?   Right Ear: Tympanic membrane and external ear normal.  ?   Left Ear: Tympanic membrane and external ear normal.  ?   Nose: Nose normal.  ?Eyes:  ?   General: No scleral icterus.    ?   Right eye: No discharge.     ?   Left eye: No discharge.  ?   Conjunctiva/sclera: Conjunctivae normal.  ?   Pupils: Pupils are equal, round, and reactive to light.  ?Neck:  ?   Thyroid: No thyromegaly.  ?   Vascular: No JVD.  ?   Trachea: No tracheal deviation.  ?Cardiovascular:  ?   Rate and Rhythm: Normal rate and regular rhythm.  ?   Heart sounds: Normal heart sounds. No murmur heard. ?  No friction rub. No gallop.  ?Pulmonary:  ?   Effort: No respiratory distress.  ?   Breath sounds: Normal breath sounds. No wheezing, rhonchi or rales.  ?Abdominal:   ?   General: Bowel sounds are normal.  ?   Palpations: Abdomen is soft. There is no mass.  ?   Tenderness: There is no abdominal tenderness. There is no guarding or rebound.  ?Musculoskeletal:  ?   Left elbow: Decreased range of motion. Tenderness present in lateral epicondyle.  ?   Cervical back: Normal range of motion and neck supple.  ?Lymphadenopathy:  ?   Cervical: No cervical adenopathy.  ?  Skin: ?   General: Skin is warm.  ?   Findings: No rash.  ?Neurological:  ?   Mental Status: He is alert and oriented to person, place, and time.  ?   Cranial Nerves: No cranial nerve deficit.  ?   Deep Tendon Reflexes: Reflexes are normal and symmetric.  ? ? ?Wt Readings from Last 3 Encounters:  ?09/13/21 255 lb (115.7 kg)  ?06/13/21 240 lb (108.9 kg)  ?04/18/21 243 lb 14.4 oz (110.6 kg)  ? ? ?BP 120/64   Pulse 68   Ht 5\' 10"  (1.778 m)   Wt 255 lb (115.7 kg)   BMI 36.59 kg/m?  ? ?Assessment and Plan: ? ?1. Lateral epicondylitis of left elbow ?New onset.  Approximately 2 weeks ago patient waxed car and afterwards developed pain in his lateral aspect of his left elbow.  Since then he is exacerbated this by folding several blankets at work.  Exam is consistent with epicondylitis there is tenderness of the lateral epicondyle and pain with extension of the wrist.  We will prescribe meloxicam 15 mg once a day and he may take Tylenol with this.  He is also been told to get an ice cube and to rub it over this area for 2 to 3 minutes several times a day.  He is also been instructed to obtain a tennis elbow strap at the pharmacy and may stop wearing this until he can see sports medicine.  We will obtain a x-ray of his left elbow to rule out avulsion fracture. ?- meloxicam (MOBIC) 15 MG tablet; Take 1 tablet (15 mg total) by mouth daily.  Dispense: 30 tablet; Refill: 0 ?- DG Elbow Complete Left; Future  ? ? ?

## 2021-09-13 NOTE — Patient Instructions (Signed)
Tennis Elbow  Tennis elbow (lateral epicondylitis) is inflammation of tendons in your outer forearm, near your elbow. Tendons are tissues that connect muscle to bone. When you have tennis elbow, inflammation affects the tendons that you use to bend your wrist and move your hand up. Inflammation occurs in the lower part of the upper arm bone (humerus), where the tendons connect to the bone (lateral epicondyle). Tennis elbow often affects people who play tennis, but anyone may get the condition from repeatedly extending the wrist or turning the forearm. What are the causes? This condition is usually caused by repeatedly extending the wrist, turning the forearm, and using the hands. It can result from sports or work that requires repetitive forearm movements. In some cases, it may be caused by a sudden injury. What increases the risk? You are more likely to develop tennis elbow if you play tennis or another racket sport. You also have a higher risk if you frequently use your hands for work. Besides people who play tennis, others at greater risk include: People who use computers. Construction workers. People who work in factories. Musicians. Cooks. Cashiers. What are the signs or symptoms? Symptoms of this condition include: Pain and tenderness in the forearm and the outer part of the elbow. Pain may be felt only when using the arm, or it may be there all the time. A burning feeling that starts in the elbow and spreads down the forearm. A weak grip in the hand. How is this diagnosed? This condition is diagnosed based on your symptoms, your medical history, and a physical exam. You may also have X-rays or an MRI to: Confirm the diagnosis. Look for other issues. Check for tears in the ligaments, muscles, or tendons. How is this treated? Resting and icing your arm is often the first treatment. Your health care provider may also recommend: Medicines to reduce pain and inflammation. These may be in  the form of a pill, topical gels, or shots of a steroid medicine (cortisone). An elbow strap to reduce stress on the area. Physical therapy. This may include massage or exercises or both. An elbow brace to restrict the movements that cause symptoms. If these treatments do not help relieve your symptoms, your health care provider may recommend surgery to remove damaged muscle and reattach healthy muscle to bone. Follow these instructions at home: If you have a brace or strap: Wear the brace or strap as told by your health care provider. Remove it only as told by your health care provider. Check the skin around the brace or strap every day. Tell your health care provider about any concerns. Loosen the brace if your fingers tingle, become numb, or turn cold and blue. Keep the brace clean. If the brace or strap is not waterproof: Do not let it get wet. Cover it with a watertight covering when you take a bath or a shower. Managing pain, stiffness, and swelling  If directed, put ice on the injured area. To do this: If you have a removable brace or strap, remove it as told by your health care provider. Put ice in a plastic bag. Place a towel between your skin and the bag. Leave the ice on for 20 minutes, 2-3 times a day. Remove the ice if your skin turns bright red. This is very important. If you cannot feel pain, heat, or cold, you have a greater risk of damage to the area. Move your fingers often to reduce stiffness and swelling. Activity Rest your elbow   and wrist and avoid activities that cause symptoms as told by your health care provider. Do physical therapy exercises as told by your health care provider. If you lift an object, lift it with your palm facing up. This reduces stress on your elbow. Lifestyle If your tennis elbow is caused by sports, check your equipment and make sure that: You use it correctly. It is good match for you. If your tennis elbow is caused by work or computer  use, take frequent breaks to stretch your arm. Talk with your employer about ways to manage your condition at work. General instructions Take over-the-counter and prescription medicines only as told by your health care provider. Do not use any products that contain nicotine or tobacco. These products include cigarettes, chewing tobacco, and vaping devices, such as e-cigarettes. If you need help quitting, ask your health care provider. Keep all follow-up visits. This is important. How is this prevented? Before and after activity: Warm up and stretch before being active. Cool down and stretch after being active. Give your body time to rest between periods of activity. During activity: Make sure to use equipment that fits you. If you play tennis, put power in your stroke with your lower body. Avoid using your arm only. Maintain physical fitness, including: Strength. Flexibility. Endurance. Do exercises to strengthen the forearm muscles. Contact a health care provider if: You have pain that gets worse or does not get better with treatment. You have numbness or weakness in your forearm, hand, or fingers. Get help right away if: Your pain is severe. You cannot move your wrist. Summary Tennis elbow (lateral epicondylitis) is inflammation of tendons in your outer forearm, near your elbow. Common symptoms include pain and tenderness in your forearm and the outer part of your elbow. This condition is usually caused by repeatedly extending your wrist, turning your forearm, and using your hands. The first treatment is often resting and icing your arm to relieve symptoms. Further treatment may include taking medicine, getting physical therapy, wearing a brace or strap, or having surgery. This information is not intended to replace advice given to you by your health care provider. Make sure you discuss any questions you have with your health care provider. Document Revised: 11/11/2019 Document  Reviewed: 11/11/2019 Elsevier Patient Education  2023 [employer] Inc[employer]  

## 2021-09-16 ENCOUNTER — Ambulatory Visit: Payer: Managed Care, Other (non HMO) | Admitting: Family Medicine

## 2021-09-16 ENCOUNTER — Encounter: Payer: Self-pay | Admitting: Family Medicine

## 2021-09-16 VITALS — BP 118/70 | HR 60 | Ht 70.0 in | Wt 246.3 lb

## 2021-09-16 DIAGNOSIS — M7712 Lateral epicondylitis, left elbow: Secondary | ICD-10-CM | POA: Diagnosis not present

## 2021-09-16 MED ORDER — DICLOFENAC SODIUM 75 MG PO TBEC: 75.0000 mg | DELAYED_RELEASE_TABLET | Freq: Two times a day (BID) | ORAL | 0 refills | Status: DC | PRN

## 2021-09-16 NOTE — Progress Notes (Signed)
?  ? ?Primary Care / Sports Medicine Office Visit ? ?Patient Information:  ?Patient ID: Pedro [name], male DOB: 1980/11/18 Age: 41 y.o. MRN: [id number]  ? ?Pedro [name] is a pleasant 41 y.o. male presenting with the following: ? ?Chief Complaint  ?Patient presents with  ? Elbow Pain  ?  3 weeks pain, is taking meloxicam and wearing brace with relief.   ? ? ?Vitals:  ? 09/16/21 1022  ?BP: 118/70  ?Pulse: 60  ?SpO2: 98%  ? ?Vitals:  ? 09/16/21 1022  ?Weight: 246 lb 4.8 oz (111.7 kg)  ?Height: 5\' 10"  (1.778 m)  ? ?Body mass index is 35.34 kg/m?. ? ?DG Elbow Complete Left ? ?Result Date: 09/13/2021 ?CLINICAL DATA:  Lateral epicondyle tenderness.  Pain for 3 weeks. EXAM: LEFT ELBOW - COMPLETE 3+ VIEW COMPARISON:  None Available. FINDINGS: No elbow joint effusion. Minimal degenerative spurring at the distal tip of the coronoid process. There are 3 ossicles measuring up to 4 mm just lateral to the lateral epicondyle in the region of the common extensor tendon origin. No acute fracture or dislocation. IMPRESSION: There are 3 small ossicles in the region of the common extensor tendon origin suggesting enthesopathic change. This may be secondary to chronic tendinosis versus remote soft tissue trauma in the region of the common extensor tendon origin. Electronically Signed   By: 11/13/2021 M.D.   On: 09/13/2021 20:15    ? ?Independent interpretation of notes and tests performed by another provider:  ? ?Independent interpretation of left elbow x-rays dated 09/13/2021 reveal scattered opacifications about the left lateral epicondyle consistent with calcific tendinopathy/enthesophyte formation, no other degenerative changes noted, no acute osseous processes seen ? ?Procedures performed:  ? ?None ? ?Pertinent History, Exam, Impression, and Recommendations:  ? ?Problem List Items Addressed This Visit   ? ?  ? Musculoskeletal and Integument  ? Epicondylitis, lateral, left - Primary  ?  Right-hand-dominant patient presenting  with several week history of severe left elbow pain diffusely with some focality laterally with radiation throughout the forearm, resulted in inability to fully extend/flex his elbow.  Was seen by his primary care provider Dr. 11/13/2021 on 09/13/2021 where he was advised meloxicam, Tylenol, ice, tennis elbow brace/strap, and x-ray was ordered.  He cites significant interval improvement though not resolution following these interventions, has noted increased range of motion, continues to wear brace, has noted pain with ADLs. ? ?Examination today reveals full painless flexion which is symmetric, he has roughly 5 degrees of deficit with extension due to stiffness, no deficits with pronation/supination, mildly tender at medial epicondyle on the left, acutely tender at the left lateral condyle, positive provocative testing consistent with lateral epicondylitis. ? 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  I discussed the same with patient in addition to reviewing the images.  Given his interval progress, I have advised a transition to as needed diclofenac to allow for dosing variability, home-based rehab program, and, given the nature of his work related requirements (multiple repetitive tasks), work restrictions until follow-up in 4 weeks.  If suboptimal progress note to return, can consider local corticosteroid injections, formal PT, advanced imaging all as clinically guided. ? ?Chronic condition, exacerbation, independent interpretation x-rays, Rx management ? ?  ?  ? Relevant Medications  ? diclofenac (VOLTAREN) 75 MG  EC tablet  ?  ? ?Orders & Medications ?Meds ordered this encounter  ?Medications  ? diclofenac (VOLTAREN) 75 MG EC tablet  ?  Sig: Take 1 tablet (75 mg total) by  mouth 2 (two) times daily as needed.  ?  Dispense:  60 tablet  ?  Refill:  0  ? ?No orders of the defined types were placed in this encounter. ?  ? ?Return in about 4 weeks (around 10/14/2021).  ?  ? ?Jerrol Banana, MD ? ? Primary Care Sports Medicine ?[facility] ? [facility]  ? ?

## 2021-09-16 NOTE — Assessment & Plan Note (Signed)
Right-hand-dominant patient presenting with several week history of severe left elbow pain diffusely with some focality laterally with radiation throughout the forearm, resulted in inability to fully extend/flex his elbow.  Was seen by his primary care provider Dr. Elizabeth Sauer on 09/13/2021 where he was advised meloxicam, Tylenol, ice, tennis elbow brace/strap, and x-ray was ordered.  He cites significant interval improvement though not resolution following these interventions, has noted increased range of motion, continues to wear brace, has noted pain with ADLs. ? ?Examination today reveals full painless flexion which is symmetric, he has roughly 5 degrees of deficit with extension due to stiffness, no deficits with pronation/supination, mildly tender at medial epicondyle on the left, acutely tender at the left lateral condyle, positive provocative testing consistent with lateral epicondylitis. ? 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  I discussed the same with patient in addition to reviewing the images.  Given his interval progress, I have advised a transition to as needed diclofenac to allow for dosing variability, home-based rehab program, and, given the nature of his work related requirements (multiple repetitive tasks), work restrictions until follow-up in 4 weeks.  If suboptimal progress note to return, can consider local corticosteroid injections, formal PT, advanced imaging all as clinically guided. ? ?Chronic condition, exacerbation, independent interpretation x-rays, Rx management ?

## 2021-09-16 NOTE — Patient Instructions (Signed)
-   Transition to diclofenac twice daily as-needed (take with food) ?- Start home exercises, use pain as a guide for steady advance ?- Can return to work with work restrictions using note provided ?- Return for follow-up in 4-5 weeks ?

## 2021-09-20 ENCOUNTER — Encounter: Payer: Managed Care, Other (non HMO) | Admitting: Family Medicine

## 2021-10-10 ENCOUNTER — Other Ambulatory Visit: Payer: Self-pay | Admitting: Family Medicine

## 2021-10-10 DIAGNOSIS — M7712 Lateral epicondylitis, left elbow: Secondary | ICD-10-CM

## 2021-10-13 ENCOUNTER — Other Ambulatory Visit: Payer: Self-pay | Admitting: Family Medicine

## 2021-10-13 DIAGNOSIS — M7712 Lateral epicondylitis, left elbow: Secondary | ICD-10-CM

## 2021-10-13 NOTE — Telephone Encounter (Signed)
Requested medication (s) are due for refill today: Due 10/17/21  Requested medication (s) are on the active medication list: yes    Last refill: 09/16/21  #60 0 refills  Future visit scheduled yes 10/14/21  Notes to clinic:Pt has appt tomorrow, please review. Thank you  Requested Prescriptions  Pending Prescriptions Disp Refills   diclofenac (VOLTAREN) 75 MG EC tablet [Pharmacy Med Name: DICLOFENAC SOD EC 75 MG TAB] 60 tablet 0    Sig: TAKE 1 TABLET BY MOUTH 2 TIMES DAILY AS NEEDED.     Analgesics:  NSAIDS Failed - 10/13/2021  2:18 AM      Failed - Manual Review: Labs are only required if the patient has taken medication for more than 8 weeks.      Failed - Cr in normal range and within 360 days    Creat  Date Value Ref Range Status  08/06/2020 0.83 0.60 - 1.35 mg/dL Final         Failed - HGB in normal range and within 360 days    Hemoglobin  Date Value Ref Range Status  08/06/2020 15.0 13.2 - 17.1 g/dL Final  08/04/2015 14.5 12.6 - 17.7 g/dL Final         Failed - PLT in normal range and within 360 days    Platelets  Date Value Ref Range Status  08/06/2020 218 140 - 400 Thousand/uL Final  08/04/2015 221 150 - 379 x10E3/uL Final         Failed - HCT in normal range and within 360 days    HCT  Date Value Ref Range Status  08/06/2020 44.8 38.5 - 50.0 % Final   Hematocrit  Date Value Ref Range Status  08/04/2015 42.7 37.5 - 51.0 % Final         Failed - eGFR is 30 or above and within 360 days    GFR, Est African American  Date Value Ref Range Status  12/04/2019 121 > OR = 60 mL/min/1.17m Final   GFR, Est Non African American  Date Value Ref Range Status  12/04/2019 104 > OR = 60 mL/min/1.742mFinal         Passed - Patient is not pregnant      Passed - Valid encounter within last 12 months    Recent Outpatient Visits           3 weeks ago Epicondylitis, lateral, left   [facility]   1 month ago Lateral epicondylitis of left  elbow   [facility]   4 months ago Establishing care with new doctor, encounter [facility]   [facility]   5 months ago [facility] respiratory infection, acute   [facility]   1 year ago [facility] of COVID-19   [facility]     Future Appointments             Tomorrow [facility]

## 2021-10-14 ENCOUNTER — Ambulatory Visit (INDEPENDENT_AMBULATORY_CARE_PROVIDER_SITE_OTHER): Payer: Managed Care, Other (non HMO) | Admitting: Family Medicine

## 2021-10-14 ENCOUNTER — Encounter: Payer: Self-pay | Admitting: Family Medicine

## 2021-10-14 VITALS — BP 130/60 | HR 60 | Ht 70.0 in | Wt 241.0 lb

## 2021-10-14 DIAGNOSIS — M7712 Lateral epicondylitis, left elbow: Secondary | ICD-10-CM

## 2021-10-14 NOTE — Progress Notes (Signed)
     Primary Care / Sports Medicine Office Visit  Patient Information:  Patient ID: Pedro [name], male DOB: [birth year] Age: 42 y.o. MRN: [id number]   Pedro [name] is a pleasant 41 y.o. male presenting with the following:  Chief Complaint  Patient presents with   Arm Pain    Vitals:   10/14/21 1009  BP: 130/60  Pulse: 60   Vitals:   10/14/21 1009  Weight: 241 lb (109.3 kg)  Height: 5\' 10"  (1.778 m)   Body mass index is 34.58 kg/m.  No results found.   Independent interpretation of notes and tests performed by another provider:   None  Procedures performed:   None  Pertinent History, Exam, Impression, and Recommendations:   Problem List Items Addressed This Visit       Musculoskeletal and Integument   Epicondylitis, lateral, left - Primary    Patient is for follow-up to left lateral epicondylitis.  At his last visit he was advised home exercises, diclofenac, and work restrictions.  Today he states that he has continued to note interval improvement, has been dosing diclofenac on as-needed basis, and has performed some of the home exercises.  He tries to use counterforce brace while at work but after forgetting it recently, pain returned, not to the same degree as it was initially.  Examination shows minimally tender lateral epicondyle, mild tenderness throughout the extensor forearm, resisted wrist extension elbow is fully extended does recreate stated symptoms, mildly improved.  Given this interval improvement, have advised options such as PT for dry needling, optimization, will defer from cortisone, as needed diclofenac, can also consider somewhat scheduled, and heavy ramp up on home exercises.  He will perform home exercises on a regular basis, I did discuss pain may slightly return and to treat this with diclofenac, scheduled if necessary, he will return for follow-up in 6 weeks.         Orders & Medications No orders of the defined types were placed in  this encounter.  No orders of the defined types were placed in this encounter.    Return in about 6 weeks (around 11/25/2021).     11/27/2021, MD   Primary Care Sports Medicine [facility]

## 2021-10-14 NOTE — Assessment & Plan Note (Signed)
Patient is for follow-up to left lateral epicondylitis.  At his last visit he was advised home exercises, diclofenac, and work restrictions.  Today he states that he has continued to note interval improvement, has been dosing diclofenac on as-needed basis, and has performed some of the home exercises.  He tries to use counterforce brace while at work but after forgetting it recently, pain returned, not to the same degree as it was initially.  Examination shows minimally tender lateral epicondyle, mild tenderness throughout the extensor forearm, resisted wrist extension elbow is fully extended does recreate stated symptoms, mildly improved.  Given this interval improvement, have advised options such as PT for dry needling, optimization, will defer from cortisone, as needed diclofenac, can also consider somewhat scheduled, and heavy ramp up on home exercises.  He will perform home exercises on a regular basis, I did discuss pain may slightly return and to treat this with diclofenac, scheduled if necessary, he will return for follow-up in 6 weeks.

## 2021-10-18 NOTE — Progress Notes (Signed)
Pt was suppose to see Dr Ashley Royalty

## 2021-11-25 ENCOUNTER — Ambulatory Visit: Payer: Managed Care, Other (non HMO) | Admitting: Family Medicine

## 2021-11-28 ENCOUNTER — Ambulatory Visit (INDEPENDENT_AMBULATORY_CARE_PROVIDER_SITE_OTHER): Payer: Managed Care, Other (non HMO) | Admitting: Family Medicine

## 2021-11-28 ENCOUNTER — Encounter: Payer: Self-pay | Admitting: Family Medicine

## 2021-11-28 VITALS — BP 128/88 | HR 70

## 2021-11-28 DIAGNOSIS — M7712 Lateral epicondylitis, left elbow: Secondary | ICD-10-CM | POA: Diagnosis not present

## 2021-11-28 NOTE — Progress Notes (Signed)
     Primary Care / Sports Medicine Office Visit  Patient Information:  Patient ID: Pedro [name], male DOB: 1980/11/19 Age: 41 y.o. MRN: [id number]   Pedro [name] is a pleasant 41 y.o. male presenting with the following:  Chief Complaint  Patient presents with   Epicondylitis, lateral, left    Pt is 90% better    Vitals:   11/28/21 1017  BP: 128/88  Pulse: 70  SpO2: 98%   There were no vitals filed for this visit. There is no height or weight on file to calculate BMI.  No results found.   Independent interpretation of notes and tests performed by another provider:   None  Procedures performed:   None  Pertinent History, Exam, Impression, and Recommendations:   Problem List Items Addressed This Visit       Musculoskeletal and Integument   Epicondylitis, lateral, left - Primary    Patient has demonstrated excellent essential symptom resolution, has been back to usual activities without significant symptom recurrence.  For episodes of recurrence he utilizes tennis elbow strap and diclofenac as needed.  His exam today is benign.  I have encouraged him to continue with home exercises and to use elbow strap prophylactically for anticipated strenuous activity, reserve usage of diclofenac for pain.  He can follow-up as needed.        Orders & Medications No orders of the defined types were placed in this encounter.  No orders of the defined types were placed in this encounter.    Return if symptoms worsen or fail to improve.     Jerrol Banana, MD   Primary Care Sports Medicine [facility]

## 2021-11-28 NOTE — Assessment & Plan Note (Signed)
Patient has demonstrated excellent essential symptom resolution, has been back to usual activities without significant symptom recurrence.  For episodes of recurrence he utilizes tennis elbow strap and diclofenac as needed.  His exam today is benign.  I have encouraged him to continue with home exercises and to use elbow strap prophylactically for anticipated strenuous activity, reserve usage of diclofenac for pain.  He can follow-up as needed.

## 2021-11-28 NOTE — Patient Instructions (Signed)
-   Continue with home exercises on a regular basis to ensure flexibility throughout the forearm - Use elbow strap brace for anticipated prolonged/strenuous activities - Use diclofenac up to twice a day on as-needed basis - Contact for questions and follow-up as needed

## 2022-05-02 ENCOUNTER — Ambulatory Visit: Admission: RE | Admit: 2022-05-02 | Payer: Managed Care, Other (non HMO) | Source: Ambulatory Visit

## 2022-05-02 ENCOUNTER — Ambulatory Visit
Admission: RE | Admit: 2022-05-02 | Discharge: 2022-05-02 | Disposition: A | Discharge status: Home / Self Care | Payer: Managed Care, Other (non HMO) | Source: Ambulatory Visit | Attending: Family Medicine | Admitting: Family Medicine

## 2022-05-02 ENCOUNTER — Encounter: Payer: Self-pay | Admitting: Family Medicine

## 2022-05-02 ENCOUNTER — Other Ambulatory Visit
Admission: RE | Admit: 2022-05-02 | Discharge: 2022-05-02 | Disposition: A | Discharge status: Home / Self Care | Payer: Managed Care, Other (non HMO) | Source: Home / Self Care | Attending: Family Medicine | Admitting: Family Medicine

## 2022-05-02 ENCOUNTER — Ambulatory Visit: Payer: Managed Care, Other (non HMO) | Admitting: Family Medicine

## 2022-05-02 VITALS — BP 110/62 | HR 78 | Ht 70.0 in | Wt 250.0 lb

## 2022-05-02 DIAGNOSIS — R079 Chest pain, unspecified: Secondary | ICD-10-CM | POA: Insufficient documentation

## 2022-05-02 DIAGNOSIS — R0602 Shortness of breath: Secondary | ICD-10-CM | POA: Diagnosis not present

## 2022-05-02 DIAGNOSIS — M940 Chondrocostal junction syndrome [Tietze]: Secondary | ICD-10-CM

## 2022-05-02 DIAGNOSIS — R9431 Abnormal electrocardiogram [ECG] [EKG]: Secondary | ICD-10-CM

## 2022-05-02 LAB — CBC WITH DIFFERENTIAL/PLATELET
Abs Immature Granulocytes: 0.02 10*3/uL (ref 0.00–0.07)
Basophils Absolute: 0 10*3/uL (ref 0.0–0.1)
Basophils Relative: 1 %
Eosinophils Absolute: 0.1 10*3/uL (ref 0.0–0.5)
Eosinophils Relative: 1 %
HCT: 46.8 % (ref 39.0–52.0)
Hemoglobin: 15.5 g/dL (ref 13.0–17.0)
Immature Granulocytes: 0 %
Lymphocytes Relative: 30 %
Lymphs Abs: 1.7 10*3/uL (ref 0.7–4.0)
MCH: 28.7 pg (ref 26.0–34.0)
MCHC: 33.1 g/dL (ref 30.0–36.0)
MCV: 86.7 fL (ref 80.0–100.0)
Monocytes Absolute: 0.7 10*3/uL (ref 0.1–1.0)
Monocytes Relative: 12 %
Neutro Abs: 3.3 10*3/uL (ref 1.7–7.7)
Neutrophils Relative %: 56 %
Platelets: 203 10*3/uL (ref 150–400)
RBC: 5.4 MIL/uL (ref 4.22–5.81)
RDW: 12.9 % (ref 11.5–15.5)
WBC: 5.8 10*3/uL (ref 4.0–10.5)
nRBC: 0 % (ref 0.0–0.2)

## 2022-05-02 NOTE — Progress Notes (Signed)
Date:  05/02/2022   Name:  Pedro [name]   DOB:  August 10, 1980   MRN:  [id number]   Chief Complaint: Cough (Dry cough, sinus drainage, lungs hurt) and Chest Pain  Chest Pain  This is a new problem. The current episode started in the past 7 days. The onset quality is gradual. The problem has been gradually worsening. The pain is present in the substernal region (along costochondral margins). The pain is at a severity of 5/10. The pain is moderate. The quality of the pain is described as sharp. The pain does not radiate. Associated symptoms include a cough, a fever and shortness of breath. Pertinent negatives include no back pain, claudication, diaphoresis, dizziness, exertional chest pressure, hemoptysis, leg pain, lower extremity edema, nausea, orthopnea, palpitations, PND or syncope. The pain is aggravated by coughing and food. The treatment provided mild relief.  Shortness of Breath This is a new problem. The current episode started in the past 7 days. The problem occurs intermittently. Associated symptoms include chest pain and a fever. Pertinent negatives include no claudication, hemoptysis, leg pain, leg swelling, orthopnea, PND, rhinorrhea, sore throat, syncope or wheezing. Treatments tried: dayquil.  Cough This is a new problem. The current episode started 1 to 4 weeks ago. The cough is Productive of sputum. Associated symptoms include chest pain, chills, a fever, postnasal drip and shortness of breath. Pertinent negatives include no hemoptysis, nasal congestion, rhinorrhea, sore throat or wheezing.    Lab Results  Component Value Date   NA 141 08/06/2020   K 4.1 08/06/2020   CO2 25 08/06/2020   GLUCOSE 78 08/06/2020   BUN 17 08/06/2020   CREATININE 0.83 08/06/2020   CALCIUM 9.4 08/06/2020   GFRNONAA 104 12/04/2019   Lab Results  Component Value Date   CHOL 220 (H) 08/06/2020   HDL 74 08/06/2020   LDLCALC 128 (H) 08/06/2020   TRIG 79 08/06/2020   CHOLHDL 3.0 08/06/2020    Lab Results  Component Value Date   TSH 2.29 08/06/2020   No results found for: "HGBA1C" Lab Results  Component Value Date   WBC 5.6 08/06/2020   HGB 15.0 08/06/2020   HCT 44.8 08/06/2020   MCV 86.0 08/06/2020   PLT 218 08/06/2020   Lab Results  Component Value Date   ALT 18 08/06/2020   AST 16 08/06/2020   ALKPHOS 87 07/31/2016   BILITOT 0.4 08/06/2020   Lab Results  Component Value Date   VD25OH 24 (L) 04/30/2018     Review of Systems  Constitutional:  Positive for chills and fever. Negative for diaphoresis.  HENT:  Positive for postnasal drip, sinus pressure and sinus pain. Negative for rhinorrhea, sneezing and sore throat.   Respiratory:  Positive for cough and shortness of breath. Negative for hemoptysis and wheezing.   Cardiovascular:  Positive for chest pain. Negative for palpitations, orthopnea, claudication, leg swelling, syncope and PND.  Gastrointestinal:  Negative for nausea.  Musculoskeletal:  Negative for back pain.  Neurological:  Negative for dizziness.    Patient Active Problem List   Diagnosis Date Noted   Epicondylitis, lateral, left 09/16/2021   Upper respiratory infection, acute 04/18/2021   Nonintractable generalized idiopathic epilepsy without status epilepticus (HCC) 05/25/2020   Seizure-like activity (HCC) 01/26/2020   Seizure (HCC) 01/14/2020   Allergic conjunctivitis 09/20/2017   Obesity (BMI 35.0-39.9 without comorbidity) 02/21/2017   Hyperlipidemia 07/31/2016   Admission for therapeutic drug monitoring 07/31/2016   Seborrheic keratosis 06/08/2016   Screening examination for STD (  sexually transmitted disease) 06/08/2016   Vitamin D deficiency 02/01/2016   Encounter for medication monitoring 02/01/2016   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   Bulge of lumbar disc without myelopathy 01/26/2015   Epilepsy (HCC) 03/17/2013    Allergies  Allergen Reactions   Caffeine    Nicotine     Past Surgical History:  Procedure Laterality Date    WISDOM TOOTH EXTRACTION  04/21/2016    Social History   Tobacco Use   Smoking status: Former    Years: 5.00    Types: Cigarettes    Start date: 05/16/1995    Quit date: 05/16/1999    Years since quitting: 22.9   Smokeless tobacco: Never  Vaping Use   Vaping Use: Never used  Substance Use Topics   Alcohol use: Not Currently    Alcohol/week: 2.0 - 3.0 standard drinks of alcohol    Types: 2 - 3 Cans of beer per week   Drug use: No     Medication list has been reviewed and updated.  Current Meds  Medication Sig   CARBATROL 200 MG 12 hr capsule Take 2 capsules in the morning and 3 capsules in the evening   DEPAKOTE ER 250 MG 24 hr tablet Take 1,000 mg by mouth daily.       05/02/2022   11:06 AM 10/14/2021   10:11 AM 09/16/2021   10:25 AM 09/13/2021   10:08 AM  GAD 7 : Generalized Anxiety Score  Nervous, Anxious, on Edge 0 0 0 0  Control/stop worrying 0 0 0 0  Worry too much - different things 0 0 0 0  Trouble relaxing 0 0 0 0  Restless 0 0 0 0  Easily annoyed or irritable 0 0 0 0  Afraid - awful might happen 0 0 0 0  Total GAD 7 Score 0 0 0 0  Anxiety Difficulty Not difficult at all Not difficult at all  Not difficult at all       05/02/2022   11:06 AM 10/14/2021   10:10 AM 09/16/2021   10:25 AM  Depression screen PHQ 2/9  Decreased Interest 0 0 0  Down, Depressed, Hopeless 0 0 0  PHQ - 2 Score 0 0 0  Altered sleeping 0 0 0  Tired, decreased energy 0 0 0  Change in appetite 0 0 0  Feeling bad or failure about yourself  0 0 0  Trouble concentrating 0 0 0  Moving slowly or fidgety/restless 0 0 0  Suicidal thoughts 0 0 0  PHQ-9 Score 0 0 0  Difficult doing work/chores Not difficult at all Not difficult at all     BP Readings from Last 3 Encounters:  05/02/22 110/62  11/28/21 128/88  10/14/21 130/60    Physical Exam Vitals and nursing note reviewed.  HENT:     Head: Normocephalic.     Right Ear: External ear normal.     Left Ear: External ear normal.      Nose: Nose normal.  Eyes:     General: No scleral icterus.       Right eye: No discharge.        Left eye: No discharge.     Conjunctiva/sclera: Conjunctivae normal.     Pupils: Pupils are equal, round, and reactive to light.  Neck:     Thyroid: No thyromegaly.     Vascular: No JVD.     Trachea: No tracheal deviation.  Cardiovascular:     Rate and Rhythm: Normal rate and regular  rhythm.     Heart sounds: Normal heart sounds. No murmur heard.    No systolic murmur is present.     No diastolic murmur is present.     No friction rub. No gallop. No S3 or S4 sounds.  Pulmonary:     Effort: No tachypnea or respiratory distress.     Breath sounds: Normal breath sounds. No decreased breath sounds, wheezing, rhonchi or rales.  Chest:     Chest wall: Tenderness present.  Abdominal:     General: Bowel sounds are normal.     Palpations: Abdomen is soft. There is no hepatomegaly, splenomegaly or mass.     Tenderness: There is no abdominal tenderness. There is no guarding or rebound.  Musculoskeletal:        General: No tenderness. Normal range of motion.     Cervical back: Normal range of motion and neck supple.     Right lower leg: No tenderness. No edema.     Left lower leg: No tenderness. No edema.  Lymphadenopathy:     Cervical: No cervical adenopathy.  Skin:    General: Skin is warm.     Findings: No rash.  Neurological:     Mental Status: He is alert and oriented to person, place, and time.     Cranial Nerves: No cranial nerve deficit.     Deep Tendon Reflexes: Reflexes are normal and symmetric.     Wt Readings from Last 3 Encounters:  05/02/22 250 lb (113.4 kg)  10/14/21 241 lb (109.3 kg)  10/14/21 241 lb (109.3 kg)    BP 110/62   Pulse 78   Ht 5\' 10"  (1.778 m)   Wt 250 lb (113.4 kg)   SpO2 98%   BMI 35.87 kg/m   Assessment and Plan:  1. Chest pain at rest New onset.  Atypical.  Stable.  Onset of cough about a week ago with onset of chest pain and shortness of  breath over the past 3 days.  Pain is described as sharp's over the substernal area along the costochondral margins.  Tenderness is appreciated in these areas.  Pulmonary auscultation is normal cardiac auscultation is normal.  EKG was done with the following results rate 77 sinus rhythm intervals normal described.  Notes possibility of LVH.  No ischemic changes noted such as Q waves, ST-T wave changes nor delay in R wave progression.  Differential diagnosis includes costochondritis as below but also cannot rule out cardiac seen that patient does have some risk noted.  We will obtain chest x-ray.  Patient has been notified that if he was to continue to have chest pain increase in intensity increasing degree of dyspnea that he is to go to an ER setting with the differential would also include ruling out MI from unstable angina as well as remotely pulmonary emboli. - EKG 12-Lead - DG Chest 2 View - Ambulatory referral to Cardiology  2. Shortness of breath New onset.  Episodic.  Stable.  Patient is having episodes of shortness of breath at rest not necessarily affected by exertion.  There is no orthopnea or PND.  There is no swelling of the legs or palpitations noted.  Patient has been noted that he does have an abnormal EKG but no ischemic changes per se.  We will refer to cardiology for evaluation and obtain a chest x-ray. - CBC - Ambulatory referral to Cardiology  3. Abnormal EKG As noted above EKG reading is consistent with possible LVH. - Ambulatory referral to  Cardiology  4. Costochondritis Exam and history is consistent with costochondritis sharp chest pain short duration/tenderness over the costochondral margins.  I have suggested patient take either Advil or Aleve in the meantime until he is able to get a cardiology consult.   Elizabeth Sauer, MD

## 2022-06-22 ENCOUNTER — Telehealth: Payer: Self-pay

## 2022-06-22 NOTE — Telephone Encounter (Signed)
Transition Care Management Follow-up Telephone Call Date of discharge and from where: 02/08/2024[facility] How have you been since you were released from the hospital? "Gave me some medicine and I have felt better" Any questions or concerns? No  Items Reviewed: Did the pt receive and understand the discharge instructions provided? Yes  Medications obtained and verified? Yes  Other? No  Any new allergies since your discharge? Yes  Dietary orders reviewed? Yes Do you have support at home? Yes   Home Care and Equipment/Supplies: none  Functional Questionnaire: (I = Independent and D = Dependent) ADLs: I  Bathing/Dressing- I  Meal Prep- I  Eating- I  Maintaining continence- I  Transferring/Ambulation- I  Managing Meds- I  Follow up appointments reviewed:  PCP Hospital f/u appt confirmed? Pt is following up with cardiology again after today. He is being closely followed by cardio. No need for PCP appt right now. [facility] f/u appt confirmed? Yes  Scheduled to see  on [facility] cardiology today 06/22/22- go back on 06/29/22 Are transportation arrangements needed? No  If their condition worsens, is the pt aware to call PCP or go to the Emergency Dept.? Yes Was the patient provided with contact information for the PCP's office or ED? Yes Was to pt encouraged to call back with questions or concerns? Yes

## 2022-06-26 ENCOUNTER — Telehealth: Payer: Self-pay

## 2022-06-26 NOTE — Telephone Encounter (Signed)
Opened in error

## 2022-08-01 ENCOUNTER — Encounter: Payer: Self-pay | Admitting: Family Medicine

## 2022-08-01 ENCOUNTER — Ambulatory Visit (INDEPENDENT_AMBULATORY_CARE_PROVIDER_SITE_OTHER): Payer: Managed Care, Other (non HMO) | Admitting: Family Medicine

## 2022-08-01 VITALS — BP 120/78 | HR 62 | Ht 70.0 in | Wt 255.0 lb

## 2022-08-01 DIAGNOSIS — Z0000 Encounter for general adult medical examination without abnormal findings: Secondary | ICD-10-CM

## 2022-08-01 LAB — HEMOCCULT GUIAC POC 1CARD (OFFICE): Fecal Occult Blood, POC: NEGATIVE — NL

## 2022-08-01 NOTE — Progress Notes (Signed)
Date:  08/01/2022   Name:  SHERRARD AMY   DOB:  19-Apr-1981   MRN:  [id number]   Chief Complaint: Annual Exam  Patient is a 42 year old male who presents for a comprehensive physical exam. The patient reports the following problems: none. Health maintenance has been reviewed up to date.      Lab Results  Component Value Date   NA 141 08/06/2020   K 4.1 08/06/2020   CO2 25 08/06/2020   GLUCOSE 78 08/06/2020   BUN 17 08/06/2020   CREATININE 0.83 08/06/2020   CALCIUM 9.4 08/06/2020   GFRNONAA 104 12/04/2019   Lab Results  Component Value Date   CHOL 220 (H) 08/06/2020   HDL 74 08/06/2020   LDLCALC 128 (H) 08/06/2020   TRIG 79 08/06/2020   CHOLHDL 3.0 08/06/2020   Lab Results  Component Value Date   TSH 2.29 08/06/2020   No results found for: "HGBA1C" Lab Results  Component Value Date   WBC 5.8 05/02/2022   HGB 15.5 05/02/2022   HCT 46.8 05/02/2022   MCV 86.7 05/02/2022   PLT 203 05/02/2022   Lab Results  Component Value Date   ALT 18 08/06/2020   AST 16 08/06/2020   ALKPHOS 87 07/31/2016   BILITOT 0.4 08/06/2020   Lab Results  Component Value Date   VD25OH 24 (L) 04/30/2018     Review of Systems  Constitutional:  Negative for chills and fever.  HENT:  Negative for drooling, ear discharge, ear pain and sore throat.   Respiratory:  Negative for cough, shortness of breath and wheezing.   Cardiovascular:  Negative for chest pain, palpitations and leg swelling.  Gastrointestinal:  Negative for abdominal pain, blood in stool, constipation, diarrhea and nausea.  Endocrine: Negative for polydipsia.  Genitourinary:  Negative for dysuria, frequency, hematuria and urgency.  Musculoskeletal:  Negative for back pain, myalgias and neck pain.  Skin:  Negative for rash.  Allergic/Immunologic: Negative for environmental allergies.  Neurological:  Negative for dizziness and headaches.  Hematological:  Does not bruise/bleed easily.  Psychiatric/Behavioral:   Negative for suicidal ideas. The patient is not nervous/anxious.     Patient Active Problem List   Diagnosis Date Noted   Epicondylitis, lateral, left 09/16/2021   Upper respiratory infection, acute 04/18/2021   Nonintractable generalized idiopathic epilepsy without status epilepticus ([place]) 05/25/2020   Seizure-like activity ([place]) 01/26/2020   Seizure ([place]) 01/14/2020   Allergic conjunctivitis 09/20/2017   Obesity (BMI 35.0-39.9 without comorbidity) 02/21/2017   Hyperlipidemia 07/31/2016   Admission for therapeutic drug monitoring 07/31/2016   Seborrheic keratosis 06/08/2016   Screening examination for STD (sexually transmitted disease) 06/08/2016   Vitamin D deficiency 02/01/2016   Encounter for medication monitoring 02/01/2016   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   Bulge of lumbar disc without myelopathy 01/26/2015   Epilepsy ([place]) 03/17/2013    Allergies  Allergen Reactions   Caffeine    Nicotine     Past Surgical History:  Procedure Laterality Date   WISDOM TOOTH EXTRACTION  04/21/2016    Social History   Tobacco Use   Smoking status: Former    Years: 5    Types: Cigarettes    Start date: 05/16/1995    Quit date: 05/16/1999    Years since quitting: 23.2   Smokeless tobacco: Never  Vaping Use   Vaping Use: Never used  Substance Use Topics   Alcohol use: Not Currently    Alcohol/week: 2.0 - 3.0 standard drinks of alcohol  Types: 2 - 3 Cans of beer per week   Drug use: No     Medication list has been reviewed and updated.  Current Meds  Medication Sig   CARBATROL 200 MG 12 hr capsule Take 2 capsules in the morning and 3 capsules in the evening   DEPAKOTE ER 250 MG 24 hr tablet Take 1,000 mg by mouth daily.       08/01/2022    9:35 AM 05/02/2022   11:06 AM 10/14/2021   10:11 AM 09/16/2021   10:25 AM  GAD 7 : Generalized Anxiety Score  Nervous, Anxious, on Edge 0 0 0 0  Control/stop worrying 0 0 0 0  Worry too much - different things 0 0 0 0  Trouble relaxing  0 0 0 0  Restless 0 0 0 0  Easily annoyed or irritable 0 0 0 0  Afraid - awful might happen 0 0 0 0  Total GAD 7 Score 0 0 0 0  Anxiety Difficulty Not difficult at all Not difficult at all Not difficult at all        08/01/2022    9:34 AM 05/02/2022   11:06 AM 10/14/2021   10:10 AM  Depression screen PHQ 2/9  Decreased Interest 0 0 0  Down, Depressed, Hopeless 0 0 0  PHQ - 2 Score 0 0 0  Altered sleeping 0 0 0  Tired, decreased energy 0 0 0  Change in appetite 0 0 0  Feeling bad or failure about yourself  0 0 0  Trouble concentrating 0 0 0  Moving slowly or fidgety/restless 0 0 0  Suicidal thoughts 0 0 0  PHQ-9 Score 0 0 0  Difficult doing work/chores Not difficult at all Not difficult at all Not difficult at all    BP Readings from Last 3 Encounters:  08/01/22 120/78  05/02/22 110/62  11/28/21 128/88    Physical Exam Vitals and nursing note reviewed.  Constitutional:      Appearance: He is well-developed and well-groomed.  HENT:     Head: Normocephalic.     Jaw: There is normal jaw occlusion.     Right Ear: Hearing, tympanic membrane, ear canal and external ear normal.     Left Ear: Hearing, tympanic membrane, ear canal and external ear normal.     Nose: Nose normal.     Mouth/Throat:     Lips: Pink.     Mouth: Mucous membranes are moist.     Dentition: Normal dentition. No gum lesions.     Tongue: No lesions.     Palate: No mass.     Pharynx: Oropharynx is clear. Uvula midline.  Eyes:     General: Lids are normal. Vision grossly intact. Gaze aligned appropriately. No scleral icterus.       Right eye: No discharge.        Left eye: No discharge.     Extraocular Movements: Extraocular movements intact.     Conjunctiva/sclera: Conjunctivae normal.     Pupils: Pupils are equal, round, and reactive to light.  Neck:     Thyroid: No thyroid mass, thyromegaly or thyroid tenderness.     Vascular: Normal carotid pulses. No carotid bruit, hepatojugular reflux or JVD.      Trachea: Trachea and phonation normal. No tracheal deviation.  Cardiovascular:     Rate and Rhythm: Normal rate and regular rhythm.     Chest Wall: PMI is not displaced. No thrill.     Pulses: Normal pulses.  Carotid pulses are 2+ on the right side and 2+ on the left side.      Radial pulses are 2+ on the right side and 2+ on the left side.       Femoral pulses are 2+ on the right side and 2+ on the left side.      Dorsalis pedis pulses are 2+ on the right side and 2+ on the left side.       Posterior tibial pulses are 2+ on the right side and 2+ on the left side.     Heart sounds: Normal heart sounds, S1 normal and S2 normal. No murmur heard.    No systolic murmur is present.     No diastolic murmur is present.     No friction rub. No gallop. No S3 or S4 sounds.  Pulmonary:     Effort: No respiratory distress.     Breath sounds: Normal breath sounds. No decreased breath sounds, wheezing, rhonchi or rales.  Chest:  Breasts:    Right: Normal.     Left: Normal.  Abdominal:     General: Bowel sounds are normal.     Palpations: Abdomen is soft. There is no mass.     Tenderness: There is no abdominal tenderness. There is no guarding or rebound.     Hernia: No hernia is present. There is no hernia in the umbilical area.  Genitourinary:    Penis: Normal and uncircumcised.      Testes: Normal.     Prostate: Normal. Not enlarged, not tender and no nodules present.     Rectum: Normal. Guaiac result negative. No mass.  Musculoskeletal:        General: No tenderness. Normal range of motion.     Cervical back: Normal range of motion and neck supple.     Right lower leg: No edema.     Left lower leg: No edema.  Lymphadenopathy:     Head:     Right side of head: No submental or submandibular adenopathy.     Left side of head: No submental or submandibular adenopathy.     Cervical: No cervical adenopathy.     Right cervical: No superficial, deep or posterior cervical  adenopathy.    Left cervical: No superficial, deep or posterior cervical adenopathy.     Upper Body:     Right upper body: No supraclavicular or axillary adenopathy.     Left upper body: No supraclavicular or axillary adenopathy.  Skin:    General: Skin is warm.     Capillary Refill: Capillary refill takes less than 2 seconds.     Findings: No rash.  Neurological:     Mental Status: He is alert and oriented to person, place, and time.     Cranial Nerves: Cranial nerves 2-12 are intact. No cranial nerve deficit.     Sensory: Sensation is intact.     Motor: Motor function is intact.     Coordination: Coordination is intact.     Deep Tendon Reflexes: Reflexes are normal and symmetric.  Psychiatric:        Behavior: Behavior is cooperative.     Wt Readings from Last 3 Encounters:  08/01/22 255 lb (115.7 kg)  05/02/22 250 lb (113.4 kg)  10/14/21 241 lb (109.3 kg)    BP 120/78   Pulse 62   Ht 5\' 10"  (1.778 m)   Wt 255 lb (115.7 kg)   SpO2 99%   BMI 36.59 kg/m   Assessment  and Plan: MONTFORD BARSE is a 42 y.o. male who presents today for his Complete Annual Exam. He feels well. He reports exercising . He reports he is sleeping Immunizations are reviewed and recommendations provided.   Age appropriate screening tests are discussed. Counseling given for risk factor reduction interventions. .   1. Annual physical exam No subjective/objective concerns noted on history of present illness, review of past medical history/medications/labs, review of systems, and physical exam. - POCT Occult Blood Stool    Otilio Miu, MD

## 2022-08-01 NOTE — Patient Instructions (Signed)
Why follow it? Research shows? ?Those who follow the Mediterranean diet have a reduced risk of heart disease  ?The diet is associated with a reduced incidence of Parkinson's and Alzheimer's diseases ?People following the diet may have longer life expectancies and lower rates of chronic diseases  ?The Dietary Guidelines for Americans recommends the Mediterranean diet as an eating plan to promote health and prevent disease ? ?What Is the Mediterranean Diet?  ?Healthy eating plan based on typical foods and recipes of Mediterranean-style cooking ?The diet is primarily a plant based diet; these foods should make up a majority of meals  ? ?Starches - Plant based foods should make up a majority of meals ?- They are an important sources of vitamins, minerals, energy, antioxidants, and fiber ?- Choose whole grains, foods high in fiber and minimally processed items  ?- Typical grain sources include wheat, oats, barley, corn, brown rice, bulgar, farro, millet, polenta, couscous  ?- Various types of beans include chickpeas, lentils, fava beans, black beans, white beans   ?Fruits  Veggies - Large quantities of antioxidant rich fruits & veggies; 6 or more servings  ?- Vegetables can be eaten raw or lightly drizzled with oil and cooked  ?- Vegetables common to the traditional Mediterranean Diet include: artichokes, arugula, beets, broccoli, brussel sprouts, cabbage, carrots, celery, collard greens, cucumbers, eggplant, kale, leeks, lemons, lettuce, mushrooms, okra, onions, peas, peppers, potatoes, pumpkin, radishes, rutabaga, shallots, spinach, sweet potatoes, turnips, zucchini ?- Fruits common to the Mediterranean Diet include: apples, apricots, avocados, cherries, clementines, dates, figs, grapefruits, grapes, melons, nectarines, oranges, peaches, pears, pomegranates, strawberries, tangerines  ?Fats - Replace butter and margarine with healthy oils, such as olive oil, canola oil, and tahini  ?- Limit nuts to no more than a  handful a day  ?- Nuts include walnuts, almonds, pecans, pistachios, pine nuts  ?- Limit or avoid candied, honey roasted or heavily salted nuts ?- Olives are central to the Mediterranean diet - can be eaten whole or used in a variety of dishes   ?Meats Protein - Limiting red meat: no more than a few times a month ?- When eating red meat: choose lean cuts and keep the portion to the size of deck of cards ?- Eggs: approx. 0 to 4 times a week  ?- Fish and lean poultry: at least 2 a week  ?- Healthy protein sources include, chicken, turkey, lean beef, lamb ?- Increase intake of seafood such as tuna, salmon, trout, mackerel, shrimp, scallops ?- Avoid or limit high fat processed meats such as sausage and bacon  ?Dairy - Include moderate amounts of low fat dairy products  ?- Focus on healthy dairy such as fat free yogurt, skim milk, low or reduced fat cheese ?- Limit dairy products higher in fat such as whole or 2% milk, cheese, ice cream  ?Alcohol - Moderate amounts of red wine is ok  ?- No more than 5 oz daily for women (all ages) and men older than age 65  ?- No more than 10 oz of wine daily for men younger than 65  ?Other - Limit sweets and other desserts  ?- Use herbs and spices instead of salt to flavor foods  ?- Herbs and spices common to the traditional Mediterranean Diet include: basil, bay leaves, chives, cloves, cumin, fennel, garlic, lavender, marjoram, mint, oregano, parsley, pepper, rosemary, sage, savory, sumac, tarragon, thyme  ? ?It?s not just a diet, it?s a lifestyle:  ?The Mediterranean diet includes lifestyle factors typical of those in the   region  ?Foods, drinks and meals are best eaten with others and savored ?Daily physical activity is important for overall good health ?This could be strenuous exercise like running and aerobics ?This could also be more leisurely activities such as walking, housework, yard-work, or taking the stairs ?Moderation is the key; a balanced and healthy diet accommodates most  foods and drinks ?Consider portion sizes and frequency of consumption of certain foods  ? ?Meal Ideas & Options:  ?Breakfast:  ?Whole wheat toast or whole wheat English muffins with peanut butter & hard boiled egg ?Steel cut oats topped with apples & cinnamon and skim milk  ?Fresh fruit: banana, strawberries, melon, berries, peaches  ?Smoothies: strawberries, bananas, greek yogurt, peanut butter ?Low fat greek yogurt with blueberries and granola  ?Egg white omelet with spinach and mushrooms ?Breakfast couscous: whole wheat couscous, apricots, skim milk, cranberries  ?Sandwiches:  ?Hummus and grilled vegetables (peppers, zucchini, squash) on whole wheat bread   ?Grilled chicken on whole wheat pita with lettuce, tomatoes, cucumbers or tzatziki  ?Tuna salad on whole wheat bread: tuna salad made with greek yogurt, olives, red peppers, capers, green onions ?Garlic rosemary lamb pita: lamb saut?ed with garlic, rosemary, salt & pepper; add lettuce, cucumber, greek yogurt to pita - flavor with lemon juice and black pepper  ?Seafood:  ?Mediterranean grilled salmon, seasoned with garlic, basil, parsley, lemon juice and black pepper ?Shrimp, lemon, and spinach whole-grain pasta salad made with low fat greek yogurt  ?Seared scallops with lemon orzo  ?Seared tuna steaks seasoned salt, pepper, coriander topped with tomato mixture of olives, tomatoes, olive oil, minced garlic, parsley, green onions and cappers  ?Meats:  ?Herbed greek chicken salad with kalamata olives, cucumber, feta  ?Red bell peppers stuffed with spinach, bulgur, lean ground beef (or lentils) & topped with feta   ?Kebabs: skewers of chicken, tomatoes, onions, zucchini, squash  ?Turkey burgers: made with red onions, mint, dill, lemon juice, feta cheese topped with roasted red peppers ?Vegetarian ?Cucumber salad: cucumbers, artichoke hearts, celery, red onion, feta cheese, tossed in olive oil & lemon juice  ?Hummus and whole grain pita points with a greek salad  (lettuce, tomato, feta, olives, cucumbers, red onion) ?Lentil soup with celery, carrots made with vegetable broth, garlic, salt and pepper  ?Tabouli salad: parsley, bulgur, mint, scallions, cucumbers, tomato, radishes, lemon juice, olive oil, salt and pepper. ?     ?

## 2023-04-24 IMAGING — CR DG ELBOW COMPLETE 3+V*L*
5 series · 5 of 5 positions shown · non-contrast
Comparison: None Available.

CLINICAL DATA: Lateral epicondyle tenderness.  Pain for 3 weeks.

EXAM:
LEFT ELBOW - COMPLETE 3+ VIEW

[elbow ap]
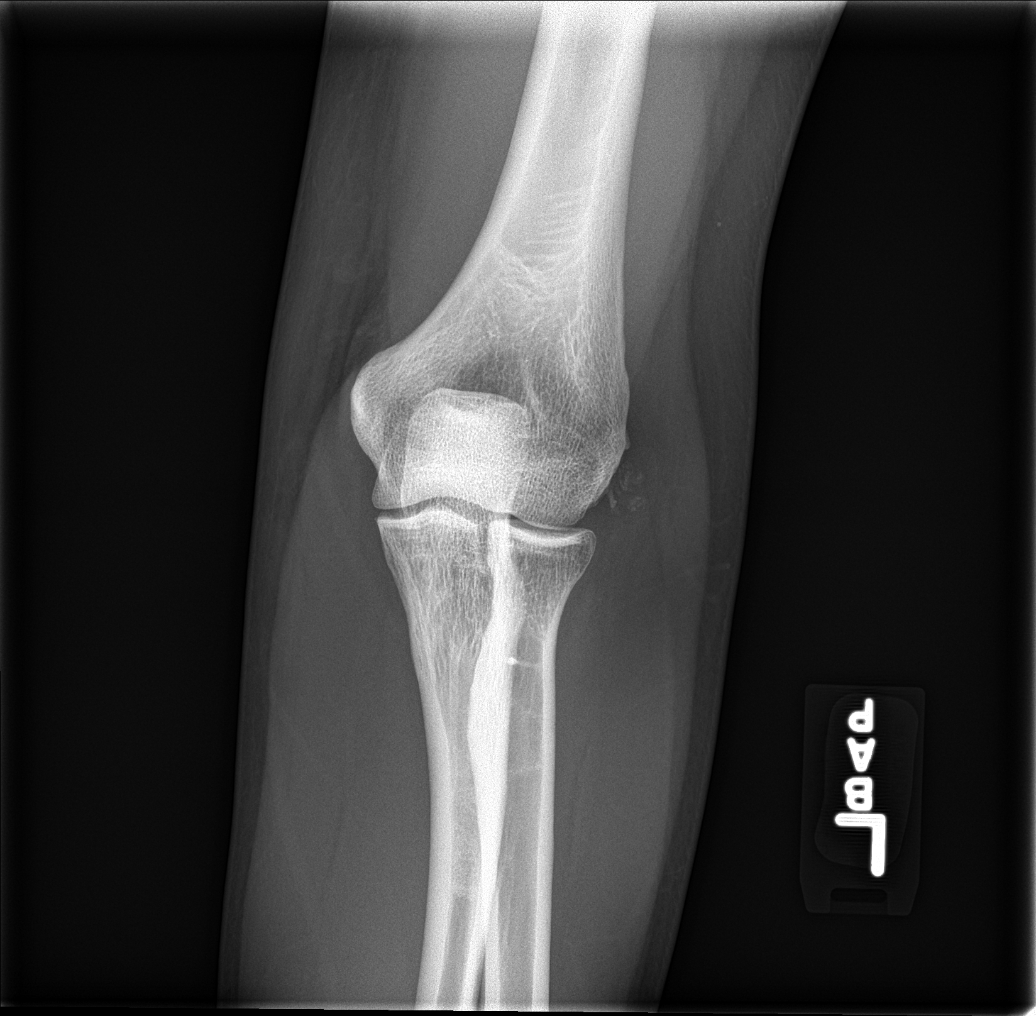

[elbow obl (1 of 3)]
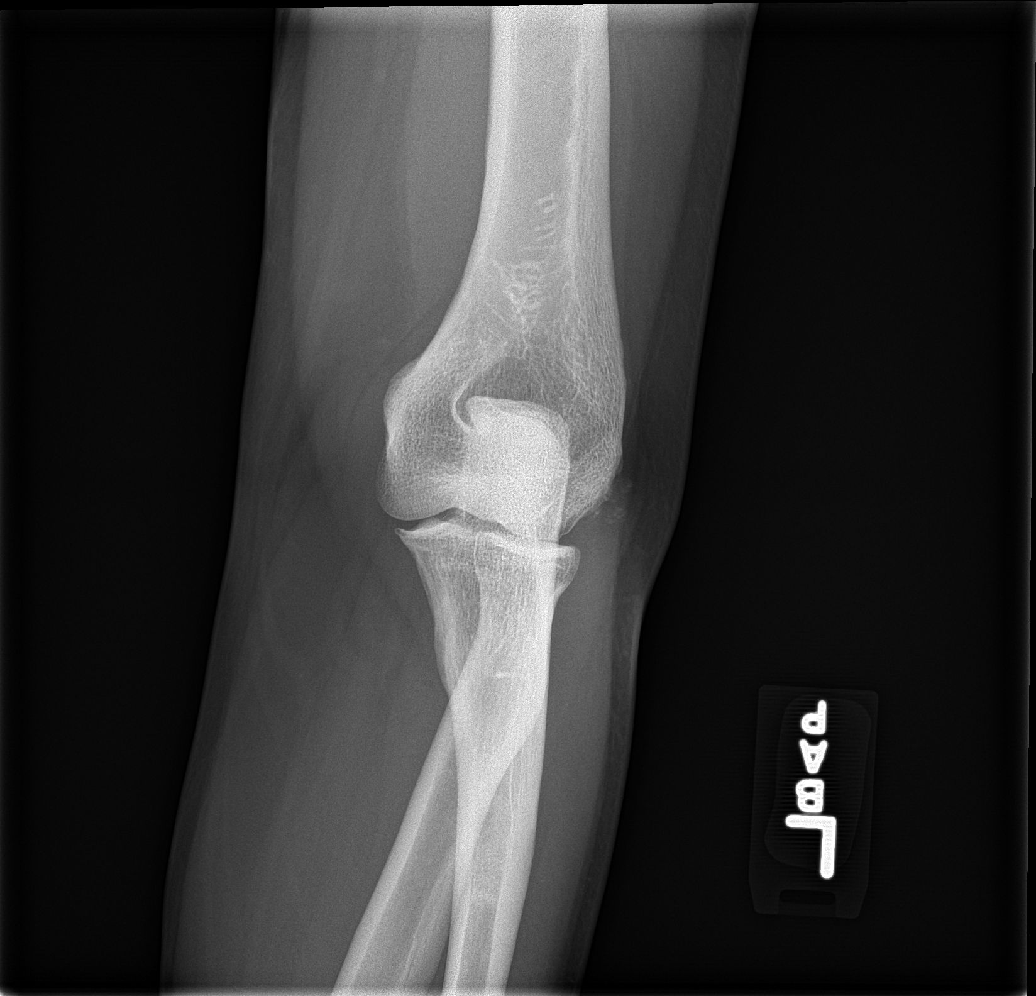

[elbow obl (2 of 3)]
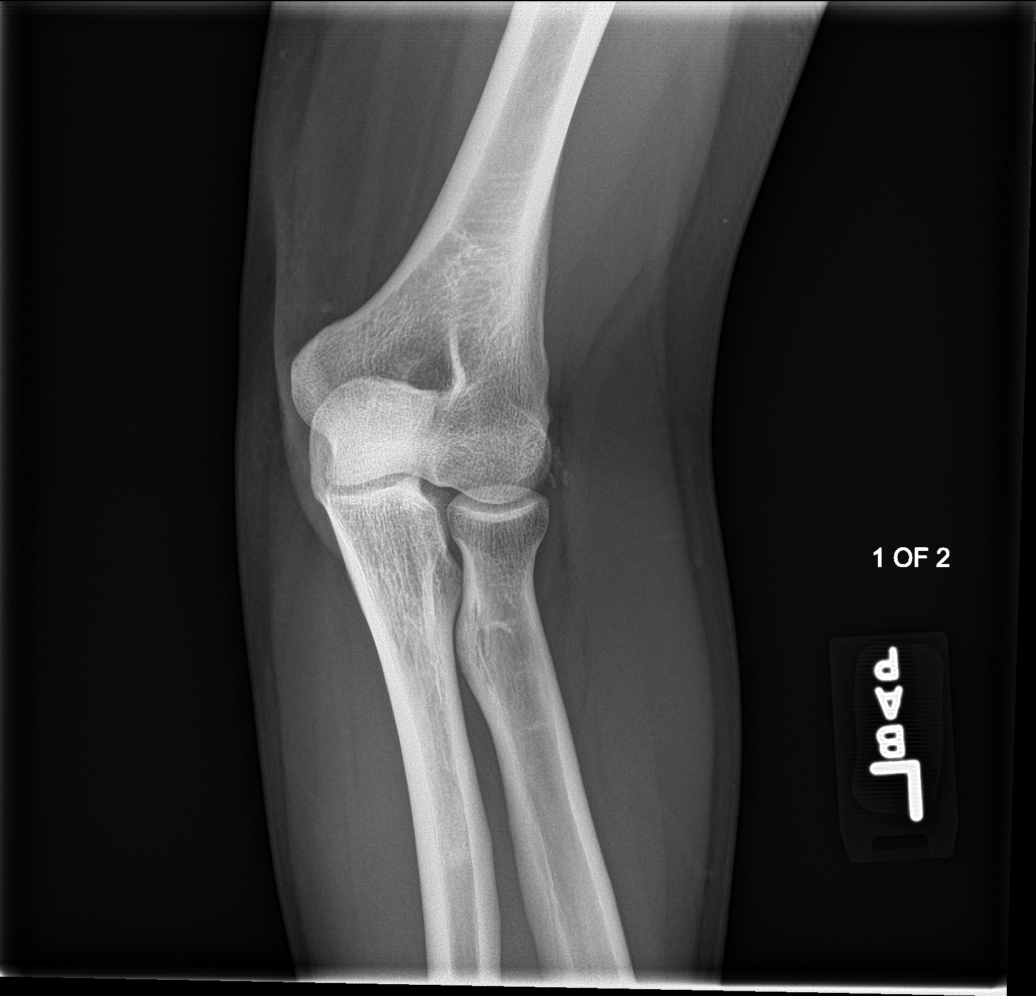

[elbow lat]
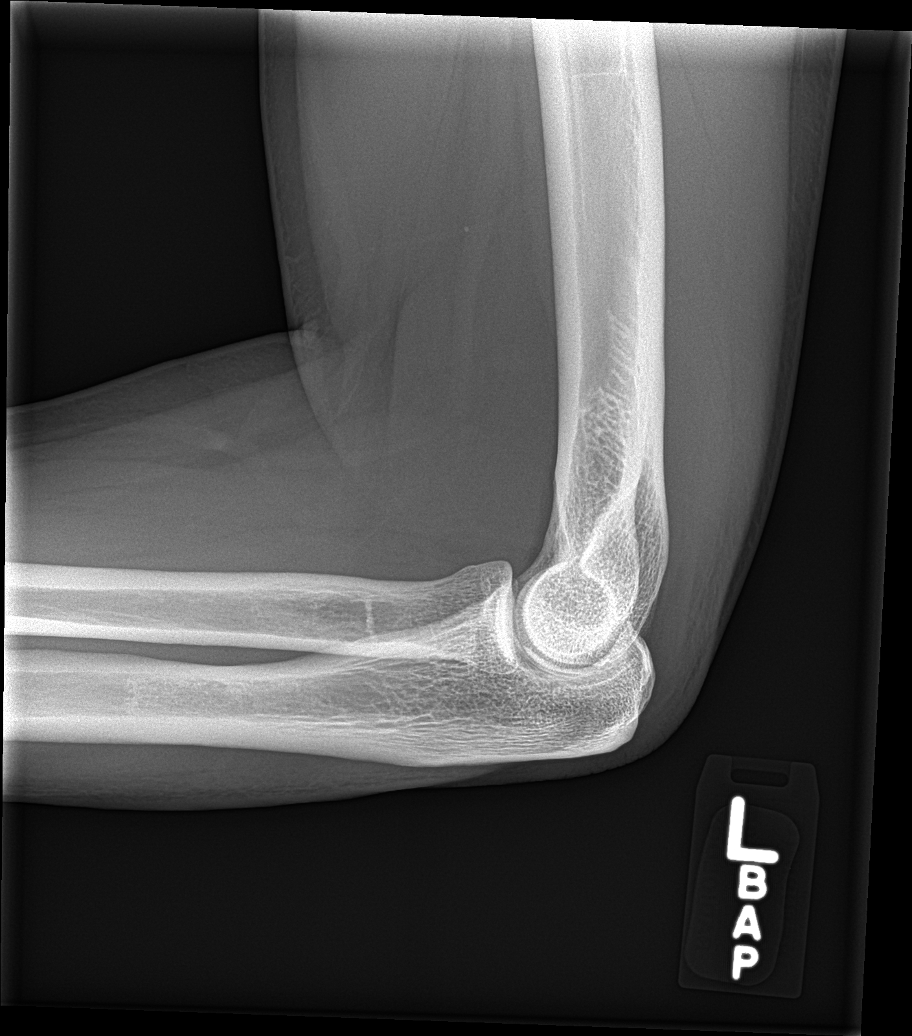

[elbow obl (3 of 3)]
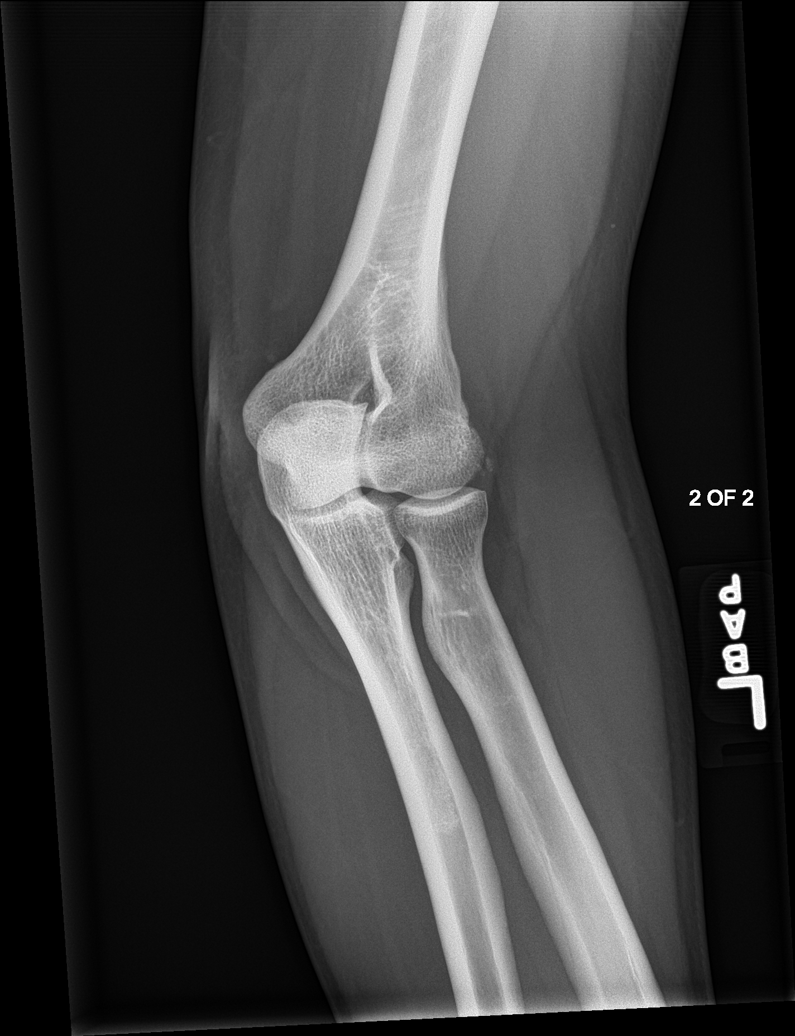

[5 of 5 positions shown; findings below may reference images not displayed]

FINDINGS: No elbow joint effusion. Minimal degenerative spurring at the distal
tip of the coronoid process. There are 3 ossicles measuring up to 4
mm just lateral to the lateral epicondyle in the region of the
common extensor tendon origin. No acute fracture or dislocation.
IMPRESSION: There are 3 small ossicles in the region of the common extensor
tendon origin suggesting enthesopathic change. This may be secondary
to chronic tendinosis versus remote soft tissue trauma in the region
of the common extensor tendon origin.

## 2023-04-30 ENCOUNTER — Ambulatory Visit: Payer: Managed Care, Other (non HMO) | Admitting: Family Medicine

## 2023-04-30 ENCOUNTER — Encounter: Payer: Self-pay | Admitting: Family Medicine

## 2023-04-30 VITALS — BP 120/70 | HR 86 | Ht 70.0 in | Wt 258.4 lb

## 2023-04-30 DIAGNOSIS — N4830 Priapism, unspecified: Secondary | ICD-10-CM | POA: Diagnosis not present

## 2023-04-30 NOTE — Patient Instructions (Signed)
Priapism Priapism is an unwanted erection of the penis that lasts longer than 4 hours even without sexual stimulation or desire. Priapism affects males of all ages. There are three types of priapism: Acute ischemic priapism, also called low-flow priapism. This involves the failure of blood to flow out of the penis. With this type, erections are painful. It can lead to erectile dysfunction. Non-ischemic priapism, also called high-flow priapism. This involves too much flow of blood into the penis. With this type, erections are usually painless. The penis gets erect but not rigid. Stuttering (recurrent) priapism. This is a form of ischemic priapism in which erections may stop and start. With this type, erections usually occur during sleep and are painful. This type can lead to erectile dysfunction. What are the causes? This condition develops either when blood has difficulty leaving the penis (low-flow priapism) or when too much blood flows into the penis (high-flow priapism). Blood flow issues may be caused by: Medicines, such as: Erectile dysfunction medicine. This is the most common cause. Medicine that is used to treat depression, anxiety, high blood pressure, or attention deficit hyperactivity disorder (ADHD). Blood thinners. Medical disorders, such as: Blood problems that are common in people who have sickle cell disease or leukemia. Diabetes. Neurological problems, such as multiple sclerosis. Tumors. An infection. An injury to the penis. Other causes may include: Excessive alcohol use. Use of marijuana. Use of illegal drugs, such as cocaine. In some cases, the cause is not known. What are the signs or symptoms? Symptoms of this condition include: A prolonged erection in the absence of stimulation. A painful erection. How is this diagnosed? This condition is diagnosed with a physical exam. Blood tests and imaging tests such as an ultrasound or MRI may be done to help identify the cause  of the condition. How is this treated? Treatment for this condition depends on the cause and the type of priapism. Acute ischemic priapism should be treated right away at a hospital, where treatment may involve: Getting fluid and medicines for pain through an IV. Having a procedure to drain blood from the penis. Having surgery to make a passageway for blood to flow in the penis (surgical shunting). Having a blood transfusion if you have sickle cell disease. No standard treatment exists for stuttering priapism. If the erections last longer than 4 hours, the treatments will be the same as the treatments for acute ischemic priapism. Non-ischemic priapism and stuttering priapism are often managed at home. Your health care provider may prescribe oral medicines that may help control or decrease the erections. Follow these instructions at home: Medicines Take over-the-counter and prescription medicines only as told by your health care provider. Do not take any medicines during an episode unless you get approval from your health care provider. General instructions     Avoid drugs or alcohol if they caused the priapism. Avoiding them can help to prevent the condition from coming back. Avoid sexual stimulation and intercourse until your health care provider says that they are okay for you. Keep track of how long your erection lasts. If it does not go away in 4 hours, seek medical care. Follow instructions from your health care provider. Your health care provider may tell you to: Try exercising or taking a warm bath. Put a cold pack and gentle pressure on your perineum, which is the area between the anus and the scrotum. Take a cold shower. Drink enough fluid to keep your urine pale yellow. Empty your bladder as much as possible.  Keep all follow-up visits. This is important. Contact a health care provider if: Your pain does not improve with treatment. You have a fever. You have more pain,  swelling, or redness in your genitals or your groin area. Get help right away if: You have acute ischemic priapism or stuttering priapism and your erection does not go away in 4 hours. Summary Priapism is an unwanted erection of the penis that usually develops without sexual stimulation or desire. Treatment for this condition depends on the cause and the type of priapism. Non-ischemic priapism and stuttering priapism are often managed at home. Acute ischemic priapism is usually treated at a hospital. Avoid drugs or alcohol if they caused the priapism. This can help to prevent the condition from coming back. Take over-the-counter and prescription medicines only as told by your health care provider. Keep track of how long your erection lasts. If it does not go away in 4 hours, seek medical care. This information is not intended to replace advice given to you by your health care provider. Make sure you discuss any questions you have with your health care provider. Document Revised: 10/15/2020 Document Reviewed: 10/15/2020 Elsevier Patient Education  2024 [employer].

## 2023-04-30 NOTE — Progress Notes (Addendum)
Date:  04/30/2023   Name:  Pedro [name]   DOB:  June 11, 1980   MRN:  [id number]   Chief Complaint: Groin Pain (Patient took a pill from convenient store. He had a erection for hours and had pain in groin area after taking pill days after. He is better but still wanted to come to his appt. )  Erectile Dysfunction This is a new problem. The current episode started 1 to 4 weeks ago (mid November). The problem has been gradually improving since onset. Nature of difficulty: prolonged erection/ almost 4 hours. He reports his erection duration to be more than 10 minutes (almost 4 hours). Irritative symptoms do not include urgency. Obstructive symptoms do not include dribbling, incomplete emptying, an intermittent stream, a slower stream, straining or a weak stream. Pertinent negatives include no chills, dysuria, genital pain, hematuria, hesitancy or inability to urinate. (Erections are normal now) The symptoms are aggravated by medications (was given outside source of erectile medication). Past treatments include nothing.    Lab Results  Component Value Date   NA 141 08/06/2020   K 4.1 08/06/2020   CO2 25 08/06/2020   GLUCOSE 78 08/06/2020   BUN 17 08/06/2020   CREATININE 0.83 08/06/2020   CALCIUM 9.4 08/06/2020   GFRNONAA 104 12/04/2019   Lab Results  Component Value Date   CHOL 220 (H) 08/06/2020   HDL 74 08/06/2020   LDLCALC 128 (H) 08/06/2020   TRIG 79 08/06/2020   CHOLHDL 3.0 08/06/2020   Lab Results  Component Value Date   TSH 2.29 08/06/2020   No results found for: "HGBA1C" Lab Results  Component Value Date   WBC 5.8 05/02/2022   HGB 15.5 05/02/2022   HCT 46.8 05/02/2022   MCV 86.7 05/02/2022   PLT 203 05/02/2022   Lab Results  Component Value Date   ALT 18 08/06/2020   AST 16 08/06/2020   ALKPHOS 87 07/31/2016   BILITOT 0.4 08/06/2020   Lab Results  Component Value Date   VD25OH 24 (L) 04/30/2018     Review of Systems  Constitutional:  Negative for  chills, diaphoresis, fatigue and fever.  HENT:  Negative for congestion, drooling, ear pain and hearing loss.   Respiratory:  Negative for shortness of breath.   Genitourinary:  Negative for dysuria, hematuria, hesitancy, incomplete emptying, penile pain, penile swelling, scrotal swelling, testicular pain and urgency.    Patient Active Problem List   Diagnosis Date Noted   Epicondylitis, lateral, left 09/16/2021   Upper respiratory infection, acute 04/18/2021   Nonintractable generalized idiopathic epilepsy without status epilepticus (HCC) 05/25/2020   Seizure-like activity (HCC) 01/26/2020   Seizure (HCC) 01/14/2020   Allergic conjunctivitis 09/20/2017   Obesity (BMI 35.0-39.9 without comorbidity) 02/21/2017   Hyperlipidemia 07/31/2016   Admission for therapeutic drug monitoring 07/31/2016   Seborrheic keratosis 06/08/2016   Screening examination for STD (sexually transmitted disease) 06/08/2016   Vitamin D deficiency 02/01/2016   Encounter for medication monitoring 02/01/2016   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   Bulge of lumbar disc without myelopathy 01/26/2015   Epilepsy (HCC) 03/17/2013    Allergies  Allergen Reactions   Caffeine    Nicotine     Past Surgical History:  Procedure Laterality Date   WISDOM TOOTH EXTRACTION  04/21/2016    Social History   Tobacco Use   Smoking status: Former    Current packs/day: 0.00    Types: Cigarettes    Start date: 05/16/1995    Quit date: 05/16/1999  Years since quitting: 23.9   Smokeless tobacco: Never  Vaping Use   Vaping status: Never Used  Substance Use Topics   Alcohol use: Not Currently    Alcohol/week: 2.0 - 3.0 standard drinks of alcohol    Types: 2 - 3 Cans of beer per week   Drug use: No     Medication list has been reviewed and updated.  Current Meds  Medication Sig   CARBATROL 200 MG 12 hr capsule Take 2 capsules in the morning and 3 capsules in the evening   DEPAKOTE ER 500 MG 24 hr tablet TAKE 2 TABLETS  (1,000 MG TOTAL) BY MOUTH AT BEDTIME.       04/30/2023    2:15 PM 08/01/2022    9:35 AM 05/02/2022   11:06 AM 10/14/2021   10:11 AM  GAD 7 : Generalized Anxiety Score  Nervous, Anxious, on Edge 0 0 0 0  Control/stop worrying 0 0 0 0  Worry too much - different things 0 0 0 0  Trouble relaxing 0 0 0 0  Restless 0 0 0 0  Easily annoyed or irritable 0 0 0 0  Afraid - awful might happen 0 0 0 0  Total GAD 7 Score 0 0 0 0  Anxiety Difficulty Not difficult at all Not difficult at all Not difficult at all Not difficult at all       04/30/2023    2:15 PM 08/01/2022    9:34 AM 05/02/2022   11:06 AM  Depression screen PHQ 2/9  Decreased Interest 0 0 0  Down, Depressed, Hopeless 0 0 0  PHQ - 2 Score 0 0 0  Altered sleeping 0 0 0  Tired, decreased energy 0 0 0  Change in appetite 0 0 0  Feeling bad or failure about yourself  0 0 0  Trouble concentrating 0 0 0  Moving slowly or fidgety/restless 0 0 0  Suicidal thoughts 0 0 0  PHQ-9 Score 0 0 0  Difficult doing work/chores Not difficult at all Not difficult at all Not difficult at all    BP Readings from Last 3 Encounters:  04/30/23 120/70  08/01/22 120/78  05/02/22 110/62    Physical Exam Vitals and nursing note reviewed.  HENT:     Head: Normocephalic.     Right Ear: External ear normal.     Left Ear: External ear normal.     Nose: Nose normal.  Eyes:     General: No scleral icterus.       Right eye: No discharge.        Left eye: No discharge.     Conjunctiva/sclera: Conjunctivae normal.     Pupils: Pupils are equal, round, and reactive to light.  Neck:     Thyroid: No thyromegaly.     Vascular: No JVD.     Trachea: No tracheal deviation.  Cardiovascular:     Rate and Rhythm: Normal rate and regular rhythm.     Heart sounds: Normal heart sounds. No murmur heard.    No friction rub. No gallop.  Pulmonary:     Effort: No respiratory distress.     Breath sounds: Normal breath sounds. No wheezing or rales.   Abdominal:     General: Bowel sounds are normal.     Palpations: Abdomen is soft. There is no mass.     Tenderness: There is no abdominal tenderness. There is no guarding or rebound.     Hernia: There is no hernia in  the left inguinal area or right inguinal area.  Genitourinary:    Penis: Normal and uncircumcised. No phimosis, erythema, tenderness or swelling.      Testes: Normal.        Right: Mass, tenderness, swelling, testicular hydrocele or varicocele not present. Right testis is descended.        Left: Mass, tenderness, swelling, testicular hydrocele or varicocele not present. Left testis is descended.     Epididymis:     Right: Normal.     Left: Normal.     Prostate: Normal. Not enlarged, not tender and no nodules present.     Rectum: Normal. Guaiac result negative. No mass.     Comments: Decreased size Musculoskeletal:        General: No tenderness. Normal range of motion.     Cervical back: Normal range of motion and neck supple.  Lymphadenopathy:     Cervical: No cervical adenopathy.  Skin:    General: Skin is warm.     Findings: No rash.  Neurological:     Mental Status: He is alert and oriented to person, place, and time.     Cranial Nerves: No cranial nerve deficit.     Deep Tendon Reflexes: Reflexes are normal and symmetric.     Wt Readings from Last 3 Encounters:  04/30/23 258 lb 6.4 oz (117.2 kg)  08/01/22 255 lb (115.7 kg)  05/02/22 250 lb (113.4 kg)    BP 120/70   Pulse 86   Ht 5\' 10"  (1.778 m)   Wt 258 lb 6.4 oz (117.2 kg)   SpO2 98%   BMI 37.08 kg/m   Assessment and Plan: 1. Prolonged erection (Primary) New onset.  Medication induced of unknown quantity and identification.  It was given a dosing by gir that afterwards cause an erection that lasted almost 4 hours.  Afterwards it was several 2 weeks of tenderness and discomfort.  Patient has had normal erections since then they did not require medications and there is been no erectile abnormalities  such as Perrone or delay or prolongation of of erection since.  I do have concerns that there may be some inflammatory concern status post the duration of this erection which was almost 4 hours and that we will look at medical aspects that could have contributed to this including diabetes myeloproliferative disorder versus testosterone concern today.  We will refer to urology for follow-up to verify that everything is stable at this time and patient has been instructed to avoid any erectile dysfunction medications until he is evaluated by urology determine if this is a course of action in the future.  Patient has been given instructions to stop with the urology department showing the diagnosis and that this is a unknown drug-induced that he was given and that perhaps we will be able to get an appointment later on this week rather than a prolonged evaluation given that priapism is a medical emergency at the time it happens but this is an afterwards concern. - Hemoglobin A1c - CBC with Differential/Platelet - Testosterone,Free and Total - Ambulatory referral to Urology     Elizabeth Sauer, MD

## 2023-05-02 LAB — CBC WITH DIFFERENTIAL/PLATELET
Basophils Absolute: 0 10*3/uL (ref 0.0–0.2)
Basos: 1 %
EOS (ABSOLUTE): 0.1 10*3/uL (ref 0.0–0.4)
Eos: 1 %
Hematocrit: 47.5 % (ref 37.5–51.0)
Hemoglobin: 15.5 g/dL (ref 13.0–17.7)
Immature Grans (Abs): 0.1 10*3/uL (ref 0.0–0.1)
Immature Granulocytes: 1 %
Lymphocytes Absolute: 2.2 10*3/uL (ref 0.7–3.1)
Lymphs: 33 %
MCH: 28.8 pg (ref 26.6–33.0)
MCHC: 32.6 g/dL (ref 31.5–35.7)
MCV: 88 fL (ref 79–97)
Monocytes Absolute: 0.8 10*3/uL (ref 0.1–0.9)
Monocytes: 11 %
Neutrophils Absolute: 3.7 10*3/uL (ref 1.4–7.0)
Neutrophils: 53 %
Platelets: 223 10*3/uL (ref 150–450)
RBC: 5.38 x10E6/uL (ref 4.14–5.80)
RDW: 13.5 % (ref 11.6–15.4)
WBC: 6.9 10*3/uL (ref 3.4–10.8)

## 2023-05-02 LAB — TESTOSTERONE,FREE AND TOTAL
Testosterone, Free: 4.4 pg/mL — ABNORMAL LOW (ref 6.8–21.5)
Testosterone: 214 ng/dL — ABNORMAL LOW (ref 264–916)

## 2023-05-02 LAB — HEMOGLOBIN A1C
Est. average glucose Bld gHb Est-mCnc: 111 mg/dL
Hgb A1c MFr Bld: 5.5 % (ref 4.8–5.6)

## 2023-06-08 ENCOUNTER — Ambulatory Visit: Payer: Managed Care, Other (non HMO) | Admitting: Urology

## 2023-07-03 ENCOUNTER — Ambulatory Visit: Payer: Managed Care, Other (non HMO) | Admitting: Urology

## 2023-07-03 VITALS — BP 135/84 | HR 72 | Ht 70.0 in | Wt 255.0 lb

## 2023-07-03 DIAGNOSIS — R102 Pelvic and perineal pain unspecified side: Secondary | ICD-10-CM

## 2023-07-03 DIAGNOSIS — Z09 Encounter for follow-up examination after completed treatment for conditions other than malignant neoplasm: Secondary | ICD-10-CM

## 2023-07-03 DIAGNOSIS — Z87438 Personal history of other diseases of male genital organs: Secondary | ICD-10-CM | POA: Diagnosis not present

## 2023-07-03 DIAGNOSIS — N4830 Priapism, unspecified: Secondary | ICD-10-CM

## 2023-07-03 NOTE — Progress Notes (Signed)
   07/03/23 8:48 AM   Zenaida Deed 11-08-1980 [id number]  CC: Prolonged erection, scrotal pain  HPI: 43 year old male who took an over-the-counter ED supplement in November 2024 and reportedly had an erection that lasted approximately 4 hours afterwards.  He also had residual bilateral scrotal pain for 1 to 2 weeks after that has continued to improve.  He reports some mild sensitivity of the testicles bilaterally, but this continues to improve.  No scrotal pain.  No pain with sexual activity.  He denies any problems with erections, and the medication over-the-counter was given to him by a sexual partner.   PMH: Past Medical History:  Diagnosis Date   Arthritis    Seizures (HCC)     Surgical History: Past Surgical History:  Procedure Laterality Date   WISDOM TOOTH EXTRACTION  04/21/2016     Family History: Family History  Problem Relation Age of Onset   Seizures Mother    Hypertension Mother    Hyperlipidemia Father    Hypertension Father    Hypertension Sister    Depression Brother     Social History:  reports that he quit smoking about 24 years ago. His smoking use included cigarettes. He started smoking about 28 years ago. He has never used smokeless tobacco. He reports that he does not currently use alcohol after a past usage of about 2.0 - 3.0 standard drinks of alcohol per week. He reports that he does not use drugs.  Physical Exam: BP 135/84 (BP Location: Left Arm, Patient Position: Sitting, Cuff Size: Large)   Pulse 72   Ht 5\' 10"  (1.778 m)   Wt 255 lb (115.7 kg)   SpO2 99%   BMI 36.59 kg/m    Constitutional:  Alert and oriented, No acute distress. Cardiovascular: No clubbing, cyanosis, or edema. Respiratory: Normal respiratory effort, no increased work of breathing. GI: Abdomen is soft, nontender, nondistended, no abdominal masses GU: Phallus with patent meatus, no lesions, testicles 15 cc and descended bilaterally without masses, no  tenderness\   Assessment & Plan:   43 year old male who took an over-the-counter ED supplement in November 2024 and had a prolonged erection approximately 4 hours afterwards with some scrotal pain that lasted at least a few weeks.  Symptoms have resolved at this point and he really denies any complaints today.  No problems with erections.  Could consider low-dose Cialis in the future if problems with ED.  Counseled to avoid over-the-counter ED supplements.  I also offered him a course of 1 week of NSAIDs for his mild residual sensitivity, but he is not bothered enough at this time to try medication.  Follow-up with urology as needed, consider course of NSAIDs in the future if persistent or residual scrotal pain/sensitivity   Legrand Rams, MD 07/03/2023  [facility] [address] [place], [place] [address] [phone]

## 2023-08-03 ENCOUNTER — Encounter: Payer: Self-pay | Admitting: Family Medicine

## 2024-02-08 ENCOUNTER — Encounter: Payer: Self-pay | Admitting: Internal Medicine

## 2024-02-08 ENCOUNTER — Ambulatory Visit: Payer: Self-pay

## 2024-02-08 NOTE — Telephone Encounter (Signed)
 FYI Only or Action Required?: FYI only for provider.  Patient was last seen in primary care on 04/30/2023 by Joshua Cathryne BROCKS, MD.  Called Nurse Triage reporting Chest Pain.  Symptoms began several days ago.  Interventions attempted: OTC medications: Dayquil.  Symptoms are: unchanged.  Triage Disposition: See PCP When Office is Open (Within 3 Days)  Patient/caregiver understands and will follow disposition?: Yes      Copied from CRM #[id number]. Topic: Clinical - Red Word Triage >> Feb 08, 2024 11:18 AM Antony RAMAN wrote: Red Word that prompted transfer to Nurse Triage: sharp chest pain, shortness of breath when walking       Reason for Disposition  [1] Chest pain(s) lasting a few seconds AND [2] persists > 3 days  Answer Assessment - Initial Assessment Questions Patient instructed to call back for new or worsening symptoms. ED precautions have been discussed. Patient verbalized understanding and agreement with this plan.       1. LOCATION: Where does it hurt?       Mid-sternal  2. RADIATION: Does the pain go anywhere else? (e.g., into neck, jaw, arms, back)     No 3. ONSET: When did the chest pain begin? (Minutes, hours or days)      6 days ago  4. PATTERN: Does the pain come and go, or has it been constant since it started?  Does it get worse with exertion?      Intermittent, worse with exertion  5. DURATION: How long does it last (e.g., seconds, minutes, hours)      Split seconds 6. SEVERITY: How bad is the pain?  (e.g., Scale 1-10; mild, moderate, or severe)     4/10 7. CARDIAC RISK FACTORS: Do you have any history of heart problems or risk factors for heart disease? (e.g., angina, prior heart attack; diabetes, high blood pressure, high cholesterol, smoker, or strong family history of heart disease)     No 8. PULMONARY RISK FACTORS: Do you have any history of lung disease?  (e.g., blood clots in lung, asthma, emphysema, birth control pills)      No 9. CAUSE: What do you think is causing the chest pain?     Unsure  10. OTHER SYMPTOMS: Do you have any other symptoms? (e.g., dizziness, nausea, vomiting, sweating, fever, difficulty breathing, cough)       Abdominal pain  Protocols used: Chest Pain-A-AH

## 2024-02-11 ENCOUNTER — Encounter: Payer: Self-pay | Admitting: Internal Medicine

## 2024-02-11 ENCOUNTER — Ambulatory Visit: Admitting: Internal Medicine

## 2024-02-11 VITALS — BP 118/68 | HR 70 | Ht 70.0 in | Wt 260.0 lb

## 2024-02-11 DIAGNOSIS — K219 Gastro-esophageal reflux disease without esophagitis: Secondary | ICD-10-CM | POA: Diagnosis not present

## 2024-02-11 DIAGNOSIS — R1011 Right upper quadrant pain: Secondary | ICD-10-CM

## 2024-02-11 DIAGNOSIS — R0789 Other chest pain: Secondary | ICD-10-CM | POA: Diagnosis not present

## 2024-02-11 MED ORDER — FAMOTIDINE 40 MG PO TABS: 40.0000 mg | ORAL_TABLET | Freq: Every day | ORAL | 0 refills | Status: DC

## 2024-02-11 NOTE — Progress Notes (Signed)
 Date:  02/11/2024   Name:  Pedro [name]   DOB:  1980/11/07   MRN:  [id number]   Chief Complaint: Chest Pain (2 years ago he had this problem and was seen by cardiologist. Was told he had nothing wrong with heart at that time. Pt does have hx of GERD. Wants EKG. )  Chest Pain  This is a recurrent problem. The pain radiates to the epigastrium. Associated symptoms include abdominal pain. Pertinent negatives include no fever, headaches, nausea, palpitations, shortness of breath or vomiting.  Abdominal Pain This is a recurrent problem. The current episode started more than 1 month ago. The onset quality is undetermined. The problem occurs intermittently. The pain is located in the epigastric region and RUQ. The pain is moderate. The quality of the pain is colicky and a sensation of fullness. The abdominal pain does not radiate. Pertinent negatives include no constipation, diarrhea, fever, headaches, nausea, vomiting or weight loss. The pain is aggravated by eating. Relieved by: bland diet. His past medical history is significant for GERD.  Gastroesophageal Reflux He complains of abdominal pain, chest pain and heartburn. He reports no nausea. This is a recurrent problem. The problem occurs frequently. The heartburn duration is an hour. The heartburn is located in the substernum. The symptoms are aggravated by certain foods. Pertinent negatives include no fatigue or weight loss.    Review of Systems  Constitutional:  Negative for chills, fatigue, fever, unexpected weight change and weight loss.  Respiratory:  Negative for chest tightness and shortness of breath.   Cardiovascular:  Positive for chest pain. Negative for palpitations and leg swelling.  Gastrointestinal:  Positive for abdominal pain and heartburn. Negative for blood in stool, constipation, diarrhea, nausea and vomiting.  Neurological:  Negative for headaches.  Psychiatric/Behavioral:  Negative for dysphoric mood and sleep  disturbance. The patient is not nervous/anxious.      Lab Results  Component Value Date   NA 141 08/06/2020   K 4.1 08/06/2020   CO2 25 08/06/2020   GLUCOSE 78 08/06/2020   BUN 17 08/06/2020   CREATININE 0.83 08/06/2020   CALCIUM 9.4 08/06/2020   GFRNONAA 104 12/04/2019   Lab Results  Component Value Date   CHOL 220 (H) 08/06/2020   HDL 74 08/06/2020   LDLCALC 128 (H) 08/06/2020   TRIG 79 08/06/2020   CHOLHDL 3.0 08/06/2020   Lab Results  Component Value Date   TSH 2.29 08/06/2020   Lab Results  Component Value Date   HGBA1C 5.5 04/30/2023   Lab Results  Component Value Date   WBC 6.9 04/30/2023   HGB 15.5 04/30/2023   HCT 47.5 04/30/2023   MCV 88 04/30/2023   PLT 223 04/30/2023   Lab Results  Component Value Date   ALT 18 08/06/2020   AST 16 08/06/2020   ALKPHOS 87 07/31/2016   BILITOT 0.4 08/06/2020   Lab Results  Component Value Date   VD25OH 24 (L) 04/30/2018     Patient Active Problem List   Diagnosis Date Noted   Gastroesophageal reflux disease 02/11/2024   Nonintractable generalized idiopathic epilepsy without status epilepticus (HCC) 05/25/2020   Obesity (BMI 35.0-39.9 without comorbidity) 02/21/2017   Mild hyperlipidemia 07/31/2016   Seborrheic keratosis 06/08/2016   Vitamin D  deficiency 02/01/2016   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   Bulge of lumbar disc without myelopathy 01/26/2015    Allergies  Allergen Reactions   Caffeine     Other Reaction(s): Other (See Comments)  Seizure   Nicotine  Other Reaction(s): Other (See Comments)  Seizure    Past Surgical History:  Procedure Laterality Date   WISDOM TOOTH EXTRACTION  04/21/2016    Social History   Tobacco Use   Smoking status: Former    Current packs/day: 0.00    Types: Cigarettes    Start date: 05/16/1995    Quit date: 05/16/1999    Years since quitting: 24.7   Smokeless tobacco: Never  Vaping Use   Vaping status: Never Used  Substance Use Topics   Alcohol use: Not  Currently    Alcohol/week: 2.0 - 3.0 standard drinks of alcohol    Types: 2 - 3 Cans of beer per week   Drug use: No     Medication list has been reviewed and updated.  Current Meds  Medication Sig   CARBATROL  200 MG 12 hr capsule Take 2 capsules in the morning and 3 capsules in the evening   DEPAKOTE  ER 250 MG 24 hr tablet Take 1,000 mg by mouth daily.   DEPAKOTE  ER 500 MG 24 hr tablet TAKE 2 TABLETS (1,000 MG TOTAL) BY MOUTH AT BEDTIME.   famotidine (PEPCID) 40 MG tablet Take 1 tablet (40 mg total) by mouth daily.       02/11/2024    9:41 AM 04/30/2023    2:15 PM 08/01/2022    9:35 AM 05/02/2022   11:06 AM  GAD 7 : Generalized Anxiety Score  Nervous, Anxious, on Edge 0 0 0 0  Control/stop worrying 0 0 0 0  Worry too much - different things 0 0 0 0  Trouble relaxing 0 0 0 0  Restless 0 0 0 0  Easily annoyed or irritable 0 0 0 0  Afraid - awful might happen 0 0 0 0  Total GAD 7 Score 0 0 0 0  Anxiety Difficulty Not difficult at all Not difficult at all Not difficult at all Not difficult at all       02/11/2024    9:41 AM 04/30/2023    2:15 PM 08/01/2022    9:34 AM  Depression screen PHQ 2/9  Decreased Interest 0 0 0  Down, Depressed, Hopeless 0 0 0  PHQ - 2 Score 0 0 0  Altered sleeping 0 0 0  Tired, decreased energy 0 0 0  Change in appetite 0 0 0  Feeling bad or failure about yourself  0 0 0  Trouble concentrating 0 0 0  Moving slowly or fidgety/restless 0 0 0  Suicidal thoughts 0 0 0  PHQ-9 Score 0 0 0  Difficult doing work/chores Not difficult at all Not difficult at all Not difficult at all    BP Readings from Last 3 Encounters:  02/11/24 118/68  07/03/23 135/84  04/30/23 120/70    Physical Exam Vitals and nursing note reviewed.  Constitutional:      General: He is not in acute distress.    Appearance: He is well-developed.  HENT:     Head: Normocephalic and atraumatic.  Cardiovascular:     Heart sounds: Normal heart sounds.  Pulmonary:      Effort: Pulmonary effort is normal. No respiratory distress.     Breath sounds: Normal breath sounds. No wheezing.  Abdominal:     General: Abdomen is flat.     Palpations: Abdomen is soft.     Tenderness: There is abdominal tenderness in the right upper quadrant. There is no right CVA tenderness, left CVA tenderness, guarding or rebound. Negative signs include Murphy's sign.  Skin:    General: Skin is warm and dry.     Findings: No rash.  Neurological:     Mental Status: He is alert and oriented to person, place, and time.  Psychiatric:        Mood and Affect: Mood normal.        Behavior: Behavior normal.     Wt Readings from Last 3 Encounters:  02/11/24 260 lb (117.9 kg)  07/03/23 255 lb (115.7 kg)  04/30/23 258 lb 6.4 oz (117.2 kg)    BP 118/68   Pulse 70   Ht 5' 10 (1.778 m)   Wt 260 lb (117.9 kg)   SpO2 99%   BMI 37.31 kg/m   Assessment and Plan:  Problem List Items Addressed This Visit       Unprioritized   Gastroesophageal reflux disease   Gerd worse recently, esp after eating pizza. May be contributing to RUQ post prandial discomfort as well Rec Pepcid 40 mg daily for 30 days then re-assess.      Relevant Medications   famotidine (PEPCID) 40 MG tablet   Other Visit Diagnoses       Other chest pain    -  Primary   likely due to GI source BP and EKG WNL   Relevant Orders   EKG 12-Lead (Completed)     Colicky RUQ abdominal pain       suspect gall bladder etiology bland diet, Pepcid will get US  and advise   Relevant Orders   US  Abdomen Limited RUQ (LIVER/GB)      Recommend scheduling a TOC/CPX with new provider in the near future.  He will check his schedule and determine if he needs a work exam as well.  No follow-ups on file.    Leita HILARIO Adie, MD [facility] [place]

## 2024-02-11 NOTE — Assessment & Plan Note (Signed)
 Gerd worse recently, esp after eating pizza. May be contributing to RUQ post prandial discomfort as well Rec Pepcid 40 mg daily for 30 days then re-assess.

## 2024-02-13 ENCOUNTER — Ambulatory Visit: Payer: Self-pay | Admitting: Internal Medicine

## 2024-02-13 ENCOUNTER — Ambulatory Visit

## 2024-02-13 ENCOUNTER — Other Ambulatory Visit

## 2024-02-13 ENCOUNTER — Ambulatory Visit
Admission: RE | Admit: 2024-02-13 | Discharge: 2024-02-13 | Disposition: A | Discharge status: Home / Self Care | Source: Ambulatory Visit | Attending: Internal Medicine | Admitting: Internal Medicine

## 2024-02-13 ENCOUNTER — Telehealth: Payer: Self-pay

## 2024-02-13 DIAGNOSIS — R1011 Right upper quadrant pain: Secondary | ICD-10-CM | POA: Insufficient documentation

## 2024-02-13 NOTE — Telephone Encounter (Signed)
 PA not needed per referral coordinator.

## 2024-02-13 NOTE — Telephone Encounter (Unsigned)
 Copied from CRM [id number]. Topic: Clinical - Request for Lab/Test Order >> Feb 13, 2024 10:08 AM Sophia H wrote: Reason for CRM: Patient is calling to let clinic know that ultrasound that was scheduled for today is being rescheduled. States a prior authorization was needed, without the prior authorization they cannot see him.

## 2024-02-13 NOTE — Telephone Encounter (Signed)
 Copied from CRM #[id number]. Topic: General - Other >> Feb 13, 2024  9:51 AM Wess RAMAN wrote: Reason for CRM: [facility] is would like to know if prior authorization has been received for patient's ultrasound.  Callback #: [phone]

## 2024-03-15 ENCOUNTER — Other Ambulatory Visit: Payer: Self-pay | Admitting: Internal Medicine

## 2024-03-15 DIAGNOSIS — K219 Gastro-esophageal reflux disease without esophagitis: Secondary | ICD-10-CM

## 2024-03-18 NOTE — Telephone Encounter (Signed)
 LOV 12/28/23  Requested Prescriptions  Pending Prescriptions Disp Refills   famotidine (PEPCID) 40 MG tablet [Pharmacy Med Name: FAMOTIDINE 40MG  TABLETS] 90 tablet 0    Sig: TAKE 1 TABLET(40 MG) BY MOUTH DAILY     Gastroenterology:  H2 Antagonists Failed - 03/18/2024  8:20 AM      Failed - Valid encounter within last 12 months    Recent Outpatient Visits           1 month ago Other chest pain   [facility], Leita DEL, MD
